# Patient Record
Sex: Female | Born: 1975
Health system: Southern US, Community
[De-identification: ages and names within clinical notes are randomized; demographics above are authoritative.]

## PROBLEM LIST (undated history)

## (undated) DIAGNOSIS — R000 Tachycardia, unspecified: Secondary | ICD-10-CM

## (undated) DIAGNOSIS — K829 Disease of gallbladder, unspecified: Secondary | ICD-10-CM

## (undated) DIAGNOSIS — C569 Malignant neoplasm of unspecified ovary: Secondary | ICD-10-CM

## (undated) DIAGNOSIS — C50919 Malignant neoplasm of unspecified site of unspecified female breast: Secondary | ICD-10-CM

## (undated) DIAGNOSIS — R9430 Abnormal result of cardiovascular function study, unspecified: Secondary | ICD-10-CM

## (undated) DIAGNOSIS — IMO0002 Reserved for concepts with insufficient information to code with codable children: Secondary | ICD-10-CM

## (undated) DIAGNOSIS — R55 Syncope and collapse: Secondary | ICD-10-CM

## (undated) DIAGNOSIS — R930 Abnormal findings on diagnostic imaging of skull and head, not elsewhere classified: Secondary | ICD-10-CM

## (undated) HISTORY — DX: Syncope and collapse: R55

## (undated) HISTORY — DX: Disease of gallbladder, unspecified: K82.9

## (undated) HISTORY — DX: Tachycardia, unspecified: R00.0

## (undated) HISTORY — PX: ABDOMINAL HYSTERECTOMY: SHX81

## (undated) HISTORY — PX: BREAST SURGERY: SHX581

## (undated) HISTORY — DX: Abnormal findings on diagnostic imaging of skull and head, not elsewhere classified: R93.0

## (undated) HISTORY — DX: Reserved for concepts with insufficient information to code with codable children: IMO0002

## (undated) HISTORY — DX: Abnormal result of cardiovascular function study, unspecified: R94.30

---

## 2000-01-13 ENCOUNTER — Other Ambulatory Visit: Admission: RE | Admit: 2000-01-13 | Discharge: 2000-01-13 | Payer: Self-pay | Admitting: Gynecology

## 2000-02-09 ENCOUNTER — Other Ambulatory Visit: Admission: RE | Admit: 2000-02-09 | Discharge: 2000-02-09 | Payer: Self-pay | Admitting: Gynecology

## 2000-10-19 ENCOUNTER — Other Ambulatory Visit: Admission: RE | Admit: 2000-10-19 | Discharge: 2000-10-19 | Payer: Self-pay | Admitting: Gynecology

## 2004-07-31 ENCOUNTER — Other Ambulatory Visit: Admission: RE | Admit: 2004-07-31 | Discharge: 2004-07-31 | Payer: Self-pay | Admitting: Obstetrics and Gynecology

## 2004-10-24 ENCOUNTER — Inpatient Hospital Stay (HOSPITAL_COMMUNITY): Admission: RE | Admit: 2004-10-24 | Discharge: 2004-10-25 | Payer: Self-pay | Admitting: Obstetrics and Gynecology

## 2011-04-06 ENCOUNTER — Emergency Department: Payer: Self-pay | Admitting: Emergency Medicine

## 2011-04-07 ENCOUNTER — Other Ambulatory Visit: Payer: Self-pay

## 2011-04-07 ENCOUNTER — Encounter: Payer: Self-pay | Admitting: Cardiology

## 2011-04-07 ENCOUNTER — Emergency Department (HOSPITAL_COMMUNITY): Payer: 59

## 2011-04-07 ENCOUNTER — Emergency Department (HOSPITAL_COMMUNITY)
Admission: EM | Admit: 2011-04-07 | Discharge: 2011-04-07 | Disposition: A | Discharge status: Home / Self Care | Payer: 59 | Attending: Emergency Medicine | Admitting: Emergency Medicine

## 2011-04-07 DIAGNOSIS — Z8543 Personal history of malignant neoplasm of ovary: Secondary | ICD-10-CM | POA: Insufficient documentation

## 2011-04-07 DIAGNOSIS — R112 Nausea with vomiting, unspecified: Secondary | ICD-10-CM | POA: Insufficient documentation

## 2011-04-07 DIAGNOSIS — R071 Chest pain on breathing: Secondary | ICD-10-CM | POA: Insufficient documentation

## 2011-04-07 DIAGNOSIS — R55 Syncope and collapse: Secondary | ICD-10-CM | POA: Insufficient documentation

## 2011-04-07 DIAGNOSIS — R51 Headache: Secondary | ICD-10-CM

## 2011-04-07 DIAGNOSIS — Z853 Personal history of malignant neoplasm of breast: Secondary | ICD-10-CM | POA: Insufficient documentation

## 2011-04-07 DIAGNOSIS — R0602 Shortness of breath: Secondary | ICD-10-CM | POA: Insufficient documentation

## 2011-04-07 DIAGNOSIS — M79609 Pain in unspecified limb: Secondary | ICD-10-CM | POA: Insufficient documentation

## 2011-04-07 HISTORY — DX: Malignant neoplasm of unspecified site of unspecified female breast: C50.919

## 2011-04-07 HISTORY — DX: Malignant neoplasm of unspecified ovary: C56.9

## 2011-04-07 LAB — CBC
HCT: 39.9 % (ref 36.0–46.0)
Hemoglobin: 13.4 g/dL (ref 12.0–15.0)
MCV: 83.5 fL (ref 78.0–100.0)
RBC: 4.78 MIL/uL (ref 3.87–5.11)
WBC: 9.8 10*3/uL (ref 4.0–10.5)

## 2011-04-07 LAB — COMPREHENSIVE METABOLIC PANEL
ALT: 26 U/L (ref 0–35)
CO2: 24 mEq/L (ref 19–32)
Calcium: 9.3 mg/dL (ref 8.4–10.5)
Creatinine, Ser: 0.65 mg/dL (ref 0.50–1.10)
GFR calc Af Amer: >90 mL/min (ref >90)
GFR calc non Af Amer: >90 mL/min (ref >90)
Glucose, Bld: 97 mg/dL (ref 70–99)
Total Bilirubin: 0.5 mg/dL (ref 0.3–1.2)

## 2011-04-07 LAB — DIFFERENTIAL
Eosinophils Relative: 0 % (ref 0–5)
Lymphocytes Relative: 23 % (ref 12–46)
Lymphs Abs: 2.3 10*3/uL (ref 0.7–4.0)
Monocytes Absolute: 0.7 10*3/uL (ref 0.1–1.0)

## 2011-04-07 LAB — PROTIME-INR: INR: 1.03 (ref 0.00–1.49)

## 2011-04-07 MED ORDER — POTASSIUM CHLORIDE CRYS ER 20 MEQ PO TBCR
40.0000 meq | EXTENDED_RELEASE_TABLET | Freq: Once | ORAL | Status: AC
  Administered 2011-04-07: 40 meq via ORAL
  Filled 2011-04-07: qty 2

## 2011-04-07 MED ORDER — IOHEXOL 350 MG/ML SOLN
100.0000 mL | Freq: Once | INTRAVENOUS | Status: AC | PRN
  Administered 2011-04-07: 100 mL via INTRAVENOUS

## 2011-04-07 MED ORDER — METOCLOPRAMIDE HCL 5 MG/ML IJ SOLN
10.0000 mg | Freq: Once | INTRAMUSCULAR | Status: AC
  Administered 2011-04-07: 10 mg via INTRAVENOUS
  Filled 2011-04-07: qty 2

## 2011-04-07 MED ORDER — MORPHINE SULFATE 4 MG/ML IJ SOLN
4.0000 mg | Freq: Once | INTRAMUSCULAR | Status: AC
  Administered 2011-04-07: 4 mg via INTRAVENOUS
  Filled 2011-04-07: qty 1

## 2011-04-07 MED ORDER — TRAMADOL HCL 50 MG PO TABS: 50.0000 mg | ORAL_TABLET | Freq: Four times a day (QID) | ORAL | Status: AC | PRN

## 2011-04-07 MED ORDER — DIPHENHYDRAMINE HCL 50 MG/ML IJ SOLN
25.0000 mg | Freq: Once | INTRAMUSCULAR | Status: AC
  Administered 2011-04-07: 25 mg via INTRAVENOUS
  Filled 2011-04-07: qty 1

## 2011-04-07 MED ORDER — ONDANSETRON HCL 4 MG/2ML IJ SOLN
4.0000 mg | Freq: Once | INTRAMUSCULAR | Status: AC
Start: 2011-04-07 — End: 2011-04-07
  Administered 2011-04-07: 4 mg via INTRAVENOUS
  Filled 2011-04-07: qty 2

## 2011-04-07 NOTE — ED Provider Notes (Signed)
  Physical Exam  BP 131/88  Pulse 94  Temp(Src) 98.1 F (36.7 C) (Oral)  Resp 20  SpO2 97%  Physical Exam  ED Course  Procedures  MDM 7:37 PM  Spoke with Patient and family about labs and imaging. Patient states she will f/u with her PCP for further evaluation. Advised immediate return to emergency room for worsening symptoms. Patient and family agree that ready for discharge. Given patient's age groups     Deanna Taylor, [place] 04/07/11 1943      Deanna Taylor, [place] 04/07/11 2024

## 2011-04-07 NOTE — ED Provider Notes (Deleted)
History     CSN: [id number] Arrival date & time: 04/07/2011  8:43 AM   First MD Initiated Contact with Patient 04/07/11 [phone]      Chief Complaint  Patient presents with  . Chest Pain    (Consider location/radiation/quality/duration/timing/severity/associated sxs/prior treatment) HPI  Past Medical History  Diagnosis Date  . Breast cancer   . Ovarian cancer     History reviewed. No pertinent past surgical history.  History reviewed. No pertinent family history.  History  Substance Use Topics  . Smoking status: Not on file  . Smokeless tobacco: Not on file  . Alcohol Use:     OB History    Grav Para Term Preterm Abortions TAB SAB Ect Mult Living                  Review of Systems  Allergies  Review of patient's allergies indicates no known allergies.  Home Medications   Current Outpatient Rx  Name Route Sig Dispense Refill  . ALBUTEROL SULFATE HFA 108 (90 BASE) MCG/ACT IN AERS Inhalation Inhale 2 puffs into the lungs 2 (two) times daily as needed. For wheezing     . TAMOXIFEN CITRATE 10 MG PO TABS Oral Take 10 mg by mouth daily.        BP 131/88  Pulse 94  Temp(Src) 98.1 F (36.7 C) (Oral)  Resp 20  SpO2 97%  Physical Exam  ED Course  Procedures  Results for orders placed during the hospital encounter of 04/07/11  CBC      Component Value Range   WBC 9.8  4.0 - 10.5 (K/uL)   RBC 4.78  3.87 - 5.11 (MIL/uL)   Hemoglobin 13.4  12.0 - 15.0 (g/dL)   HCT 11.9  14.7 - 82.9 (%)   MCV 83.5  78.0 - 100.0 (fL)   MCH 28.0  26.0 - 34.0 (pg)   MCHC 33.6  30.0 - 36.0 (g/dL)   RDW 56.2  13.0 - 86.5 (%)   Platelets 381  150 - 400 (K/uL)  DIFFERENTIAL      Component Value Range   Neutrophils Relative 69  43 - 77 (%)   Neutro Abs 6.8  1.7 - 7.7 (K/uL)   Lymphocytes Relative 23  12 - 46 (%)   Lymphs Abs 2.3  0.7 - 4.0 (K/uL)   Monocytes Relative 7  3 - 12 (%)   Monocytes Absolute 0.7  0.1 - 1.0 (K/uL)   Eosinophils Relative 0  0 - 5 (%)   Eosinophils  Absolute 0.0  0.0 - 0.7 (K/uL)   Basophils Relative 0  0 - 1 (%)   Basophils Absolute 0.0  0.0 - 0.1 (K/uL)  COMPREHENSIVE METABOLIC PANEL      Component Value Range   Sodium 139  135 - 145 (mEq/L)   Potassium 3.3 (*) 3.5 - 5.1 (mEq/L)   Chloride 104  96 - 112 (mEq/L)   CO2 24  19 - 32 (mEq/L)   Glucose, Bld 97  70 - 99 (mg/dL)   BUN 7  6 - 23 (mg/dL)   Creatinine, Ser 7.84  0.50 - 1.10 (mg/dL)   Calcium 9.3  8.4 - 69.6 (mg/dL)   Total Protein 7.8  6.0 - 8.3 (g/dL)   Albumin 3.3 (*) 3.5 - 5.2 (g/dL)   AST 18  0 - 37 (U/L)   ALT 26  0 - 35 (U/L)   Alkaline Phosphatase 63  39 - 117 (U/L)   Total Bilirubin 0.5  0.3 -  1.2 (mg/dL)   GFR calc non Af Amer >90  >90 (mL/min)   GFR calc Af Amer >90  >90 (mL/min)  APTT      Component Value Range   aPTT 27  24 - 37 (seconds)  PROTIME-INR      Component Value Range   Prothrombin Time 13.7  11.6 - 15.2 (seconds)   INR 1.03  0.00 - 1.49   POCT I-STAT TROPONIN I      Component Value Range   Troponin i, poc 0.00  0.00 - 0.08 (ng/mL)   Comment 3                MDM  7:37 PM  Spoke with Patient and family about labs and imaging. Patient states she will f/u with her PCP for further evaluation. Advised immediate return to emergency room for worsening symptoms. Patient and family agree that ready for discharge. Given patient's age groups     Thomasene Lot, [place] 04/07/11 1943

## 2011-04-07 NOTE — ED Provider Notes (Signed)
11:48 AM  Patient in CDU holding for MR head and CT angio chest.  Pt reports pain in chest has improved, though she still has discomfort with taking a deep breath.  Reports headache behind her eyes.  On exam, pt is A&Ox4, NAD, RRR, CTAB, left chest TTP, extremities without edema, distal pulses intact.  Will continue to follow.    2:32 PM Patient with continued headache.  Has requested migraine medications.    Discussed MR head with Dr Lynelle Doctor.  Patient to follow up with PCP regarding findings.    3:22 PM Patient signed out to Thomasene Lot, PA-C, at change of shift pending CT angio chest.     Rise Patience, PA 04/07/11 1522

## 2011-04-07 NOTE — ED Notes (Signed)
I applied a restriction band to pt's left arm, pt has had a lumpectomy. AM JG.

## 2011-04-07 NOTE — ED Notes (Signed)
Pt reports that she had a sudden onset of chest pain this morning. Reports that she was seen at [facility] for the same yesterday. Reports that she was told to follow-up with cardiology. Bp- 105/78 Hr-110 20g in the left hand

## 2011-04-07 NOTE — ED Provider Notes (Signed)
See CDU notes  Ward Givens, MD 04/07/11 [phone]

## 2011-04-07 NOTE — ED Provider Notes (Cosign Needed)
History     CSN: [id number] Arrival date & time: 04/07/2011  8:43 AM   First MD Initiated Contact with Patient 04/07/11 [phone]      Chief Complaint  Patient presents with  . Chest Pain    (Consider location/radiation/quality/duration/timing/severity/associated sxs/prior treatment) HPI  She relates yesterday she was at work making a routine phone call and had abrupt onset of sharp chest pain that became tight and she indicates the left side of her chest. She states and then developed to a pulling sensation like someone sitting on her chest. She states that lasted about 5 minutes. She also developed a sharp headache behind her right eye that lasted for hours. She relates she must have passed out because her coworkers called EMS and told her she had passed out. She does not remember feeling presyncopal. She relates afterwards she felt warm for a few minutes with nausea and vomiting but no diarrhea or abdominal pain. She states she was trembling afterwards and she was taken to [facility] DD by EMS. There she was given Dilaudid and Ativan and was diagnosed with panic attack and hypertension. She relates today she was sleeping at about 6:30 AM was awakened again with chest pain. She states nothing makes it feel worse nothing makes it feel better. It had it does have a mild pleuritic component to it she denies shortness of breath and does have some achiness in her calves. She thinks she may have had a fever yesterday when she was in the ER at 101. She denies cough and states her headache is back behind her right thigh. She was given nitroglycerin x2 today and get 4 baby aspirin by EMS which improved her chest pain. Denies any trauma or change in activity.  He should has a history of breast cancer about 4 years ago. She is on tamoxifen.  Primary care physician Dr. Sudie Bailey Oncologist Dr.Khan  Past Medical History  Diagnosis Date  . Breast cancer   . Ovarian cancer     History reviewed. No pertinent  past surgical history.  Left breast lumpectomy  History reviewed. No pertinent family history.  A maternal and had DVT, PE  Another maternal aunt had DVT and a cranial aneurysm  paternal grandmother had congestive heart failure Maternal grandmother had bypass surgery   History  Substance Use Topics  . Smoking status: no  . Smokeless tobacco: Not on file  . Alcohol Use: no   employed  OB History    Grav Para Term Preterm Abortions TAB SAB Ect Mult Living                  Review of Systems  All other systems reviewed and are negative.    Allergies  Review of patient's allergies indicates no known allergies.  Home Medications   Current Outpatient Rx  Name Route Sig Dispense Refill  . ALBUTEROL SULFATE HFA 108 (90 BASE) MCG/ACT IN AERS Inhalation Inhale 2 puffs into the lungs 2 (two) times daily as needed. For wheezing     . TAMOXIFEN CITRATE 10 MG PO TABS Oral Take 10 mg by mouth daily.        BP 123/85  Pulse 105  Temp(Src) 98.3 F (36.8 C) (Oral)  Resp 27  SpO2 100%  Vital signs tachycardia otherwise normal  Physical Exam  Nursing note and vitals reviewed. Constitutional: She is oriented to person, place, and time. She appears well-developed and well-nourished.  Non-toxic appearance. She does not appear ill. No distress.  HENT:  Head: Normocephalic and atraumatic.  Right Ear: External ear normal.  Left Ear: External ear normal.  Nose: Nose normal. No mucosal edema or rhinorrhea.  Mouth/Throat: Oropharynx is clear and moist and mucous membranes are normal. No dental abscesses or uvula swelling.  Eyes: Conjunctivae and EOM are normal. Pupils are equal, round, and reactive to light.  Neck: Normal range of motion and full passive range of motion without pain. Neck supple.  Cardiovascular: Normal rate, regular rhythm and normal heart sounds.  Exam reveals no gallop and no friction rub.   No murmur heard. Pulmonary/Chest: Effort normal and breath sounds normal.  No respiratory distress. She has no wheezes. She has no rhonchi. She has no rales. She exhibits tenderness. She exhibits no crepitus.       Mild chest tenderness around the sternum  Abdominal: Soft. Normal appearance and bowel sounds are normal. She exhibits no distension. There is no tenderness. There is no rebound and no guarding.  Musculoskeletal: Normal range of motion. She exhibits no edema and no tenderness.       Moves all extremities well.   Neurological: She is alert and oriented to person, place, and time. She has normal strength. No cranial nerve deficit.  Skin: Skin is warm, dry and intact. No rash noted. No erythema. No pallor.  Psychiatric: She has a normal mood and affect. Her speech is normal and behavior is normal. Her mood appears not anxious.    ED Course  Procedures (including critical care time)  Results for orders placed during the hospital encounter of 04/07/11  CBC      Component Value Range   WBC 9.8  4.0 - 10.5 (K/uL)   RBC 4.78  3.87 - 5.11 (MIL/uL)   Hemoglobin 13.4  12.0 - 15.0 (g/dL)   HCT 52.8  41.3 - 24.4 (%)   MCV 83.5  78.0 - 100.0 (fL)   MCH 28.0  26.0 - 34.0 (pg)   MCHC 33.6  30.0 - 36.0 (g/dL)   RDW 01.0  27.2 - 53.6 (%)   Platelets 381  150 - 400 (K/uL)  DIFFERENTIAL      Component Value Range   Neutrophils Relative 69  43 - 77 (%)   Neutro Abs 6.8  1.7 - 7.7 (K/uL)   Lymphocytes Relative 23  12 - 46 (%)   Lymphs Abs 2.3  0.7 - 4.0 (K/uL)   Monocytes Relative 7  3 - 12 (%)   Monocytes Absolute 0.7  0.1 - 1.0 (K/uL)   Eosinophils Relative 0  0 - 5 (%)   Eosinophils Absolute 0.0  0.0 - 0.7 (K/uL)   Basophils Relative 0  0 - 1 (%)   Basophils Absolute 0.0  0.0 - 0.1 (K/uL)  COMPREHENSIVE METABOLIC PANEL      Component Value Range   Sodium 139  135 - 145 (mEq/L)   Potassium 3.3 (*) 3.5 - 5.1 (mEq/L)   Chloride 104  96 - 112 (mEq/L)   CO2 24  19 - 32 (mEq/L)   Glucose, Bld 97  70 - 99 (mg/dL)   BUN 7  6 - 23 (mg/dL)   Creatinine, Ser 6.44   0.50 - 1.10 (mg/dL)   Calcium 9.3  8.4 - 03.4 (mg/dL)   Total Protein 7.8  6.0 - 8.3 (g/dL)   Albumin 3.3 (*) 3.5 - 5.2 (g/dL)   AST 18  0 - 37 (U/L)   ALT 26  0 - 35 (U/L)   Alkaline Phosphatase 63  39 -  117 (U/L)   Total Bilirubin 0.5  0.3 - 1.2 (mg/dL)   GFR calc non Af Amer >90  >90 (mL/min)   GFR calc Af Amer >90  >90 (mL/min)  APTT      Component Value Range   aPTT 27  24 - 37 (seconds)  PROTIME-INR      Component Value Range   Prothrombin Time 13.7  11.6 - 15.2 (seconds)   INR 1.03  0.00 - 1.49   POCT I-STAT TROPONIN I      Component Value Range   Troponin i, poc 0.00  0.00 - 0.08 (ng/mL)   Comment 3            Laboratory Interpretation hypokalemia otherwise normal   Ct Angio Chest W/cm &/or Wo Cm  04/07/2011  *RADIOLOGY REPORT*  Clinical Data:  Chest pain.  Shortness of breath.  Syncope.  CT ANGIOGRAPHY CHEST WITH CONTRAST 04/07/2011:  Technique:  Multidetector CT imaging of the chest was performed using the standard protocol during bolus administration of intravenous contrast.  Multiplanar CT image reconstructions including MIPs were obtained to evaluate the vascular anatomy.  Contrast: 100 ml Omnipaque 350 IV.  Comparison:  No prior CT.  Portable chest x-ray earlier same date.  Findings:  Contrast opacification of pulmonary arteries is good. No filling defects within either main pulmonary artery or their branches in either lung to suggest pulmonary embolism.  Heart size upper normal.  No pericardial effusion.  No visible coronary artery calcification.  No visible atherosclerosis involving the thoracic or upper abdominal aorta or their visualized branches.  Low lung volumes account for atelectasis in the lower lobes.  Lungs otherwise clear without localized airspace consolidation, interstitial disease, or parenchymal nodules or masses.  No pleural effusions.  Central airways patent.  No significant mediastinal, hilar, or axillary lymphadenopathy. Visualized thyroid gland  unremarkable.  High attenuation material within the gallbladder.  Visualized upper abdomen otherwise unremarkable.  Bone window images unremarkable.  Review of the MIP images confirms the above findings.  IMPRESSION:  1.  No evidence of pulmonary embolism. 2.  Low lung volumes account for mild atelectasis in the lower lobes.  No acute cardiopulmonary disease otherwise. 3.  Gallbladder sludge and/or numerous tiny gallstones.  Original Report Authenticated By: Arnell Sieving, M.D.   Mr Angiogram Head Wo Contrast  04/07/2011  *RADIOLOGY REPORT*  Clinical Data:   Family history of aneurysms.  Headaches.  Chest pain.  MRA HEAD WITHOUT CONTRAST  Technique: Angiographic images of the Circle of Willis were obtained using MRA technique without intravenous contrast. Source images were also evaluated  Comparison: None.  Findings:  The right internal carotid artery in its petrous segment is normal.  There is a mild focal prominence at the petrous cavernous junction. Distal to this vessel is of normal caliber in the cavernous and supraclinoid segments.  The right middle and the right anterior cerebral arteries demonstrate adequate caliber and flow signal.  The anterior communicating artery is patent  with flow signal the left anterior cerebral artery into the A3 regions the right internal carotid artery.  A right posterior communicating artery is also seen.  The left internal carotid artery in its petrous and its proximal cavernous segments appears widely patent.  There is a focal tapered significant reduced caliber involving the distal cavernous/supraclinoid junctions.  Distal to this the supraclinoid segment is normal.  The left middle cerebral artery is seen to be of adequate caliber and flow signal.  No appreciable flow signal is  seen the left anterior cerebral artery A1 segment.  Vertebrobasilar junctions are patent with flow signal  seen in both posterior-inferior cerebellar arteries.  The basilar artery, the  posterior cerebral arteries, the superior cerebellar arteries and the anterior-inferior cerebellar arteries demonstrate adequate caliber and flow signal.  IMPRESSION: 1.Mild focal prominence of the right internal carotid artery at the petrous cavernous junction, which may represent an eccentric atherosclerotic plaque.  2. Significantly decreased caliber of the left internal carotid artery at the cavernous supraclinoid junction.  This may represent a potentially significant atherosclerotic related stenosis. 3. 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  Original Report Authenticated By: Oneal Grout, M.D.   Dg Chest Portable 1 View  04/07/2011  *RADIOLOGY REPORT*  Clinical Data: Chest pain.  Syncopal episode at work yesterday.  PORTABLE CHEST - 1 VIEW  Comparison: None.  Findings: The heart size is normal.  The lung volumes are low.  No focal airspace disease is evident.  The visualized soft tissues and bony thorax are unremarkable.  IMPRESSION:  1.  No acute cardiopulmonary disease. 2.  Low lung volumes.  Original Report Authenticated By: Jamesetta Orleans. MATTERN, M.D.     Date: 04/07/2011  Rate: 104  Rhythm: sinus tachycardia  QRS Axis: normal  Intervals: normal  ST/T Wave abnormalities: nonspecific T wave changes  Conduction Disutrbances:none  Narrative Interpretation:   Old EKG Reviewed: none available  Pt placed in CDU to get her CT angio chest to r/o PE and her MR brain to r/o  Aneurysm   Diagnosis Headache Chest pain syncope  Devoria Albe, MD, Armando Gang    MDM          Ward Givens, MD 04/07/11 [phone]

## 2011-04-07 NOTE — ED Notes (Signed)
IV infusing,  Questionable if infiltrated, called IV team to re-evaluate site, st's they are busy and ask if someone else could look at it.  Mandy, RN assessed IV and thinks it is OK.  CT called and made aware of new site, st's they will

## 2011-04-07 NOTE — ED Provider Notes (Signed)
Evaluation and management procedures were performed by the PA/NP under my supervision/collaboration.   Shary Lamos, MD 04/07/11 2346 

## 2011-04-07 NOTE — ED Notes (Signed)
Pt brought back from CT due to IV ? Infiltration.  CT pulled IV.  Will attempt another IV access.

## 2011-04-16 ENCOUNTER — Encounter: Payer: 59 | Admitting: Cardiology

## 2011-04-29 ENCOUNTER — Encounter: Payer: Self-pay | Admitting: Cardiology

## 2011-04-29 DIAGNOSIS — R079 Chest pain, unspecified: Secondary | ICD-10-CM | POA: Insufficient documentation

## 2011-04-30 ENCOUNTER — Ambulatory Visit (INDEPENDENT_AMBULATORY_CARE_PROVIDER_SITE_OTHER): Payer: 59 | Admitting: Cardiology

## 2011-04-30 ENCOUNTER — Encounter: Payer: Self-pay | Admitting: Cardiology

## 2011-04-30 DIAGNOSIS — R55 Syncope and collapse: Secondary | ICD-10-CM

## 2011-04-30 DIAGNOSIS — R079 Chest pain, unspecified: Secondary | ICD-10-CM

## 2011-04-30 DIAGNOSIS — R930 Abnormal findings on diagnostic imaging of skull and head, not elsewhere classified: Secondary | ICD-10-CM

## 2011-04-30 DIAGNOSIS — R000 Tachycardia, unspecified: Secondary | ICD-10-CM

## 2011-04-30 DIAGNOSIS — K829 Disease of gallbladder, unspecified: Secondary | ICD-10-CM

## 2011-04-30 DIAGNOSIS — R072 Precordial pain: Secondary | ICD-10-CM

## 2011-04-30 NOTE — Assessment & Plan Note (Signed)
There is an incidental finding of gallbladder sludge and or numerous tiny stones by chest CT. It does not appear that she's having significant symptoms from this at this time. I feel that this is not the basis of her chest discomfort currently.

## 2011-04-30 NOTE — Assessment & Plan Note (Signed)
The description of the MRA report is in the history of present illness of this note. The study was done April 07, 2011 at [facility]. It was read by Dr.Deveshwar. Dr. Corliss Skains has a great expertise in the evaluation of the vascular anatomy of the head and neck. I will refer the patient to see Dr. Corliss Skains in consultation at [facility].

## 2011-04-30 NOTE — Progress Notes (Signed)
HPI  The patient is being seen in consultation for the evaluation of chest pain and syncope. She is a very pleasant, reliable, 35 year old female. There is no known documented cardiovascular disease. On April 06, 2011 the patient felt poorly at work with some chest discomfort and nausea. She had an episode of syncope. She was evaluated at the [facility] emergency room. These records are not available to me. The patient was then at home and resting during the evening. When she got up on the morning of April 07, 2011, she then again had some recurrent chest discomfort and some nausea. She had a second syncopal episode. This was witnessed by her mother. There was no definite seizure activity. She was transported to [facility]. I have reviewed the records from that emergency room visit extensively. There was no evidence of cardiac injury. She had a CT scan of the chest revealing no pulmonary or malaise. There was gallbladder sludge and/or numerous tiny gallstones.  The patient also had some headaches. There was a family history of aneurysms. Therefore MRA of the head was done and read by Dr.Deveshwar. The impression included   1. Mild focal prominence of the right internal carotid artery at the [facility] cavernous junction, which may represent an eccentric atherosclerotic plaque.    2. Significantly decreased caliber of the left internal carotid artery at the cavernous supraclinoid junction. This may represent a potentially significant atherosclerotic related stenosis.   3. 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  The patient was stable and was allowed to go home from the emergency room. Since that time she has had some continuous chest discomfort. There is definite pain to palpation. Her primary physician felt that she probably had costochondritis. She has taken some ibuprofen that has helped. There is no exertional component to this discomfort.  She says it may bother her more at nighttime in the lying position.  No Known Allergies  Current Outpatient Prescriptions  Medication Sig Dispense Refill  . albuterol (PROVENTIL HFA;VENTOLIN HFA) 108 (90 BASE) MCG/ACT inhaler Inhale 2 puffs into the lungs 2 (two) times daily as needed. For wheezing       . tamoxifen (NOLVADEX) 10 MG tablet Take 10 mg by mouth daily.          History   Social History  . Marital Status: Married    Spouse Name: N/A    Number of Children: N/A  . Years of Education: N/A   Occupational History  . Not on file.   Social History Main Topics  . Smoking status: Never Smoker   . Smokeless tobacco: Never Used  . Alcohol Use: Not on file  . Drug Use: Not on file  . Sexually Active: Not on file   Other Topics Concern  . Not on file   Social History Narrative  . No narrative on file    No family history on file.  Past Medical History  Diagnosis Date  . Breast cancer   . Ovarian cancer   . Chest pain     Emergency room April 07, 2011  . Syncope     December, 2012  . Abnormal MRA, head     December, 2012    No past surgical history on file.  ROS  Patient denies fever, chills, headache, sweats, rash, change in vision, change in hearing, cough, nausea vomiting, urinary symptoms. All other systems are reviewed and are negative.   PHYSICAL EXAM Patient is  stable. She is overweight. She is oriented to person time and place. Affect is normal. Head is atraumatic. There is no jugulovenous distention. There are no carotid bruits. Lungs are clear. Respiratory effort is nonlabored. Cardiac exam reveals an S1 and S2. There no clicks or significant murmurs. The abdomen is soft. There is no peripheral edema. There no musculoskeletal deformities. There are no skin rashes.  Filed Vitals:   04/30/11 0849  BP: 127/88  Pulse: 99  Height: 5\' 1"  (1.549 m)  Weight: 199 lb (90.266 kg)  SpO2: 100%    EKG EKG is done today and reviewed by me. There is  normal sinus rhythm. The QRS is normal. There is no diagnostic abnormality.  ASSESSMENT & PLAN

## 2011-04-30 NOTE — Assessment & Plan Note (Addendum)
Patient has borderline sinus tachycardia. I do not see a TSH result from the labs at [facility]. This may have been drawn through Dr. Sherril Croon' office recently. We will check for this. TSH result is obtained. The value is 3.1, normal

## 2011-04-30 NOTE — Patient Instructions (Signed)
   Echo  21 day Cardionet monitor  If the results of your test are normal or stable, you will receive a letter.  If they are abnormal, the nurse will contact you by phone. Your physician wants you to follow up in:  1 month.  See above for appointment.

## 2011-04-30 NOTE — Assessment & Plan Note (Signed)
At this point there is no definite proof of coronary artery disease. Two-dimensional echo will be done to assess her LV function. I have considered an exercise test. I will decide about this in the future. She does have musculoskeletal chest pain. This is the most likely etiology of her pain at this time.

## 2011-04-30 NOTE — Assessment & Plan Note (Signed)
The patient's syncope is concerning. She will have a two-dimensional echo. Both episodes occurred around the time that she felt her chest discomfort. It is possible that this could be a vagal reaction. She has not had syncope in the past. I am arranging for an event recorder to fully assess her rhythm over several weeks.

## 2011-05-04 ENCOUNTER — Other Ambulatory Visit: Payer: Self-pay | Admitting: Interventional Radiology

## 2011-05-04 ENCOUNTER — Other Ambulatory Visit (HOSPITAL_COMMUNITY): Payer: Self-pay | Admitting: Interventional Radiology

## 2011-05-04 DIAGNOSIS — R930 Abnormal findings on diagnostic imaging of skull and head, not elsewhere classified: Secondary | ICD-10-CM

## 2011-05-04 DIAGNOSIS — I771 Stricture of artery: Secondary | ICD-10-CM

## 2011-05-07 ENCOUNTER — Other Ambulatory Visit (INDEPENDENT_AMBULATORY_CARE_PROVIDER_SITE_OTHER): Payer: 59 | Admitting: *Deleted

## 2011-05-07 DIAGNOSIS — R55 Syncope and collapse: Secondary | ICD-10-CM

## 2011-05-07 DIAGNOSIS — R079 Chest pain, unspecified: Secondary | ICD-10-CM

## 2011-05-07 DIAGNOSIS — R072 Precordial pain: Secondary | ICD-10-CM

## 2011-05-08 ENCOUNTER — Ambulatory Visit (HOSPITAL_COMMUNITY)
Admission: RE | Admit: 2011-05-08 | Discharge: 2011-05-08 | Disposition: A | Discharge status: Home / Self Care | Payer: 59 | Source: Ambulatory Visit | Attending: Interventional Radiology | Admitting: Interventional Radiology

## 2011-05-08 ENCOUNTER — Other Ambulatory Visit (HOSPITAL_COMMUNITY): Payer: Self-pay | Admitting: Interventional Radiology

## 2011-05-08 DIAGNOSIS — I771 Stricture of artery: Secondary | ICD-10-CM

## 2011-05-08 DIAGNOSIS — I701 Atherosclerosis of renal artery: Secondary | ICD-10-CM

## 2011-05-11 ENCOUNTER — Other Ambulatory Visit: Payer: Self-pay | Admitting: Radiology

## 2011-05-11 ENCOUNTER — Encounter (HOSPITAL_COMMUNITY): Payer: Self-pay | Admitting: Pharmacy Technician

## 2011-05-12 ENCOUNTER — Other Ambulatory Visit (HOSPITAL_COMMUNITY): Payer: Self-pay | Admitting: Interventional Radiology

## 2011-05-12 ENCOUNTER — Ambulatory Visit (HOSPITAL_COMMUNITY)
Admission: RE | Admit: 2011-05-12 | Discharge: 2011-05-12 | Disposition: A | Discharge status: Home / Self Care | Payer: 59 | Source: Ambulatory Visit | Attending: Interventional Radiology | Admitting: Interventional Radiology

## 2011-05-12 DIAGNOSIS — I701 Atherosclerosis of renal artery: Secondary | ICD-10-CM

## 2011-05-12 DIAGNOSIS — Z853 Personal history of malignant neoplasm of breast: Secondary | ICD-10-CM | POA: Insufficient documentation

## 2011-05-12 DIAGNOSIS — R9409 Abnormal results of other function studies of central nervous system: Secondary | ICD-10-CM | POA: Insufficient documentation

## 2011-05-12 DIAGNOSIS — Z8543 Personal history of malignant neoplasm of ovary: Secondary | ICD-10-CM | POA: Insufficient documentation

## 2011-05-12 DIAGNOSIS — R51 Headache: Secondary | ICD-10-CM | POA: Insufficient documentation

## 2011-05-12 LAB — CBC
Hemoglobin: 12.2 g/dL (ref 12.0–15.0)
MCH: 27.7 pg (ref 26.0–34.0)
MCHC: 33.1 g/dL (ref 30.0–36.0)
Platelets: 366 10*3/uL (ref 150–400)
RDW: 14 % (ref 11.5–15.5)

## 2011-05-12 LAB — BASIC METABOLIC PANEL
BUN: 19 mg/dL (ref 6–23)
Calcium: 9.3 mg/dL (ref 8.4–10.5)
Creatinine, Ser: 0.67 mg/dL (ref 0.50–1.10)
GFR calc Af Amer: >90 mL/min (ref >90)
GFR calc non Af Amer: >90 mL/min (ref >90)
Potassium: 4 mEq/L (ref 3.5–5.1)

## 2011-05-12 LAB — PROTIME-INR
INR: 0.91 (ref 0.00–1.49)
Prothrombin Time: 12.5 seconds (ref 11.6–15.2)

## 2011-05-12 LAB — DIFFERENTIAL
Basophils Absolute: 0 10*3/uL (ref 0.0–0.1)
Basophils Relative: 0 % (ref 0–1)
Eosinophils Absolute: 0 10*3/uL (ref 0.0–0.7)
Neutro Abs: 7 10*3/uL (ref 1.7–7.7)
Neutrophils Relative %: 68 % (ref 43–77)

## 2011-05-12 MED ORDER — IOHEXOL 300 MG/ML  SOLN
150.0000 mL | Freq: Once | INTRAMUSCULAR | Status: AC | PRN
  Administered 2011-05-12: 84 mL via INTRA_ARTERIAL

## 2011-05-12 MED ORDER — MIDAZOLAM HCL 2 MG/2ML IJ SOLN
INTRAMUSCULAR | Status: AC
  Filled 2011-05-12: qty 6

## 2011-05-12 MED ORDER — FENTANYL CITRATE 0.05 MG/ML IJ SOLN
INTRAMUSCULAR | Status: AC
  Filled 2011-05-12: qty 4

## 2011-05-12 MED ORDER — FENTANYL CITRATE 0.05 MG/ML IJ SOLN
INTRAMUSCULAR | Status: AC | PRN
  Administered 2011-05-12 (×3): 25 ug via INTRAVENOUS

## 2011-05-12 MED ORDER — SODIUM CHLORIDE 0.9 % IV SOLN
INTRAVENOUS | Status: DC
  Administered 2011-05-12: 75 mL/h via INTRAVENOUS
  Administered 2011-05-12: 07:00:00 via INTRAVENOUS

## 2011-05-12 MED ORDER — MIDAZOLAM HCL 5 MG/5ML IJ SOLN
INTRAMUSCULAR | Status: AC | PRN
  Administered 2011-05-12 (×2): 1 mg via INTRAVENOUS

## 2011-05-12 MED ORDER — HEPARIN SOD (PORK) LOCK FLUSH 100 UNIT/ML IV SOLN
500.0000 [IU] | Freq: Once | INTRAVENOUS | Status: AC
  Administered 2011-05-12: 500 [IU] via INTRAVENOUS

## 2011-05-12 MED ORDER — ONDANSETRON HCL 4 MG/2ML IJ SOLN
INTRAMUSCULAR | Status: AC
  Administered 2011-05-12: 4 mg via INTRAVENOUS
  Filled 2011-05-12: qty 2

## 2011-05-12 MED ORDER — SODIUM CHLORIDE 0.9 % IV SOLN: INTRAVENOUS | Status: AC

## 2011-05-12 NOTE — Progress Notes (Signed)
After pt walked in the hall she felt dizzy, rechecked vs and had her sit up a few minutes to see if she felt better.  Will continue to monitor

## 2011-05-12 NOTE — H&P (Signed)
Deanna Taylor is an 36 y.o. female.   Chief Complaint: headaches, possible left carotid artery stenosis HPI: Patient with history of headaches, family history of cerebral aneurysms and recent MRA head revealing possible left internal carotid artery stenosis presents today for elective diagnostic cerebral arteriogram for further evaluation.  Past Medical History  Diagnosis Date  . Breast cancer   . Ovarian cancer   . Chest pain     Emergency room April 07, 2011  . Syncope     December, 2012  . Abnormal MRA, head     December, 2012  . Gallbladder disease     CT chest December, 2012, gallbladder sludge and/or numerous tiny gallstones  . Tachycardia     Borderline sinus tachycardia December, 2012    PSH: hysterectomy, left breast lumpectomy with LN dissection Social History:  reports that she has never smoked. She has never used smokeless tobacco. Her alcohol and drug histories not on file. Family History: positive for cerebral aneurysms, HTN, DM,CAD Allergies: No Known Allergies  Medications Prior to Admission  Medication Sig Dispense Refill  . albuterol (PROVENTIL HFA;VENTOLIN HFA) 108 (90 BASE) MCG/ACT inhaler Inhale 2 puffs into the lungs 2 (two) times daily as needed. For wheezing       . tamoxifen (NOLVADEX) 10 MG tablet Take 10 mg by mouth daily.         Medications Prior to Admission  Medication Dose Route Frequency Provider Last Rate Last Dose  . 0.9 %  sodium chloride infusion   Intravenous Continuous Robet Leu, PA 20 mL/hr at 05/12/11 1610      Results for orders placed during the hospital encounter of 05/12/11 (from the past 48 hour(s))  APTT     Status: Normal   Collection Time   05/12/11  7:05 AM      Component Value Range Comment   aPTT 27  24 - 37 (seconds)   BASIC METABOLIC PANEL     Status: Normal   Collection Time   05/12/11  7:05 AM      Component Value Range Comment   Sodium 140  135 - 145 (mEq/L)    Potassium 4.0  3.5 - 5.1 (mEq/L)    Chloride 105   96 - 112 (mEq/L)    CO2 25  19 - 32 (mEq/L)    Glucose, Bld 97  70 - 99 (mg/dL)    BUN 19  6 - 23 (mg/dL)    Creatinine, Ser 9.60  0.50 - 1.10 (mg/dL)    Calcium 9.3  8.4 - 10.5 (mg/dL)    GFR calc non Af Amer >90  >90 (mL/min)    GFR calc Af Amer >90  >90 (mL/min)   PROTIME-INR     Status: Normal   Collection Time   05/12/11  7:05 AM      Component Value Range Comment   Prothrombin Time 12.5  11.6 - 15.2 (seconds)    INR 0.91  0.00 - 1.49     No results found.  Review of Systems  Constitutional: Negative for fever and chills.  Eyes: Negative for blurred vision and double vision.  Respiratory: Negative for cough and shortness of breath.   Cardiovascular: Positive for chest pain.       Episodes of syncope in past  ? Vasovagal  Neg cardiac w/u  Gastrointestinal: Negative for nausea, vomiting and abdominal pain.  Neurological: Positive for headaches. Negative for dizziness, tingling, tremors, sensory change, speech change, focal weakness and seizures.  Endo/Heme/Allergies: Does  not bruise/bleed easily.    Blood pressure 117/81, pulse 94, temperature 98.1 F (36.7 C), temperature source Oral, resp. rate 18, height 5\' 1"  (1.549 m), weight 187 lb (84.823 kg), SpO2 100.00%. Physical Exam  Constitutional: She is oriented to person, place, and time. She appears well-developed and well-nourished.  Cardiovascular: Normal rate and regular rhythm.   Respiratory: Effort normal and breath sounds normal.  GI: Soft. Bowel sounds are normal.  Musculoskeletal: Normal range of motion. She exhibits no edema.  Neurological: She is alert and oriented to person, place, and time. No cranial nerve deficit. Coordination normal.  Psychiatric: She has a normal mood and affect.     Assessment/Plan: Patient with history of headaches and recent MRA head revealing possible left internal carotid artery stenosis; plan is for diagnostic cerebral arteriogram. Jarret Torre,D KEVIN 05/12/2011, 8:10 AM

## 2011-05-12 NOTE — Procedures (Signed)
S/P 4 vessel cerebral arteriogram.  Rt CFA approach. Preliminary findings. 1. 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 No acute complications.

## 2011-05-12 NOTE — Progress Notes (Signed)
Pt stated she felt better now, no dizziness.  Ambulated pt in hallway and pt stated she had no dizziness. Reviewed pt d/c instructions wit her an her family who verbalized understanding.  Pt d/c home via wheelchair with family.

## 2011-05-13 ENCOUNTER — Telehealth: Payer: Self-pay | Admitting: *Deleted

## 2011-05-13 NOTE — Telephone Encounter (Signed)
Pt notified and verbalized understanding.

## 2011-05-13 NOTE — Telephone Encounter (Signed)
Pt left message on voicemail asking for return call.   Pt states she has not received monitor ordered in December by Dr. Myrtis Ser. Monitor order was placed on 12/28. Per [employer], event monitor's are on back order. She will change order to STAT and pt should receive monitor by Friday.  Attempted to reach pt to notify her of this. Left message to call back on voicemail.

## 2011-05-18 ENCOUNTER — Encounter: Payer: Self-pay | Admitting: Cardiology

## 2011-05-18 ENCOUNTER — Encounter: Payer: Self-pay | Admitting: *Deleted

## 2011-05-18 DIAGNOSIS — R9430 Abnormal result of cardiovascular function study, unspecified: Secondary | ICD-10-CM | POA: Insufficient documentation

## 2011-05-27 ENCOUNTER — Encounter: Payer: Self-pay | Admitting: Cardiology

## 2011-05-27 ENCOUNTER — Ambulatory Visit: Payer: 59 | Admitting: Cardiology

## 2011-07-14 NOTE — Telephone Encounter (Signed)
Pt did not wear [employer] Event monitor. Per [employer] notification they could not reach pt for initiation of monitor. Pt no showed for her follow up appt on 05/27/11 with Dr. Myrtis Ser. A no show letter was mailed that day but pt has not contacted the office to reschedule her appt. Attempted to reach pt to inquire her plans for follow up and wearing the monitor. Unable to reach pt at contact number provided.

## 2012-05-16 ENCOUNTER — Telehealth (HOSPITAL_COMMUNITY): Payer: Self-pay | Admitting: Interventional Radiology

## 2012-05-16 ENCOUNTER — Other Ambulatory Visit (HOSPITAL_COMMUNITY): Payer: Self-pay | Admitting: Interventional Radiology

## 2012-07-16 ENCOUNTER — Emergency Department (HOSPITAL_COMMUNITY): Payer: 59

## 2012-07-16 ENCOUNTER — Emergency Department (HOSPITAL_COMMUNITY)
Admission: EM | Admit: 2012-07-16 | Discharge: 2012-07-17 | Disposition: A | Discharge status: Home / Self Care | Payer: 59 | Attending: Emergency Medicine | Admitting: Emergency Medicine

## 2012-07-16 ENCOUNTER — Encounter (HOSPITAL_COMMUNITY): Payer: Self-pay

## 2012-07-16 DIAGNOSIS — Z79899 Other long term (current) drug therapy: Secondary | ICD-10-CM | POA: Insufficient documentation

## 2012-07-16 DIAGNOSIS — M25519 Pain in unspecified shoulder: Secondary | ICD-10-CM | POA: Insufficient documentation

## 2012-07-16 DIAGNOSIS — Z853 Personal history of malignant neoplasm of breast: Secondary | ICD-10-CM | POA: Insufficient documentation

## 2012-07-16 DIAGNOSIS — Z8719 Personal history of other diseases of the digestive system: Secondary | ICD-10-CM | POA: Insufficient documentation

## 2012-07-16 DIAGNOSIS — M25511 Pain in right shoulder: Secondary | ICD-10-CM

## 2012-07-16 DIAGNOSIS — Z8679 Personal history of other diseases of the circulatory system: Secondary | ICD-10-CM | POA: Insufficient documentation

## 2012-07-16 DIAGNOSIS — Z8543 Personal history of malignant neoplasm of ovary: Secondary | ICD-10-CM | POA: Insufficient documentation

## 2012-07-16 DIAGNOSIS — Z8669 Personal history of other diseases of the nervous system and sense organs: Secondary | ICD-10-CM | POA: Insufficient documentation

## 2012-07-16 DIAGNOSIS — M549 Dorsalgia, unspecified: Secondary | ICD-10-CM | POA: Insufficient documentation

## 2012-07-16 DIAGNOSIS — M542 Cervicalgia: Secondary | ICD-10-CM | POA: Insufficient documentation

## 2012-07-16 MED ORDER — DIAZEPAM 5 MG PO TABS
5.0000 mg | ORAL_TABLET | Freq: Once | ORAL | Status: AC
  Administered 2012-07-17: 5 mg via ORAL
  Filled 2012-07-16: qty 1

## 2012-07-16 NOTE — ED Provider Notes (Signed)
History    This chart was scribed for non-physician practitioner working with Toy Baker, MD by Frederik Pear, ED Scribe. This patient was seen in room WTR5/WTR5 and the patient's care was started at 2310.   CSN: [id number]  Arrival date & time 07/16/12  2300   First MD Initiated Contact with Patient 07/16/12 2310      No chief complaint on file.   (Consider location/radiation/quality/duration/timing/severity/associated sxs/prior treatment) Patient is a 37 y.o. female presenting with shoulder pain. The history is provided by the patient. No language interpreter was used.  Shoulder Pain This is a new problem. The current episode started 12 to 24 hours ago. The problem occurs constantly. Pertinent negatives include no chest pain and no abdominal pain. Exacerbated by: Turning her head. The symptoms are relieved by ice and NSAIDs. She has tried a warm compress (NSAIDS) for the symptoms. The treatment provided mild relief.  Shoulder Pain This is a new problem. The current episode started 12 to 24 hours ago. The problem occurs constantly. Associated symptoms include neck pain. Pertinent negatives include no abdominal pain, chest pain, fever or numbness. Exacerbated by: Turning her head. She has tried a warm compress (NSAIDS) for the symptoms. The treatment provided mild relief.    Deanna Taylor is a 37 y.o. female who presents to the Emergency Department complaining of sudden onset, 10/10, constant, right shoulder pain that radiates to her upper back and to the right side of her neck that is aggravated by turning her head that began at 0930 when she was taking off a t-shirt and heard a pop. She reports that she treated the pain with ibuprofen and heat that seemed to relieve the pain for a period of time before worsening the symptoms.   Past Medical History  Diagnosis Date  . Breast cancer   . Ovarian cancer   . Chest pain     Emergency room April 07, 2011  . Syncope     December, 2012   . Abnormal MRA, head     December, 2012  . Gallbladder disease     CT chest December, 2012, gallbladder sludge and/or numerous tiny gallstones  . Tachycardia     Borderline sinus tachycardia December, 2012  . Ejection fraction     EF 55-60%, echo, 05/2011    No past surgical history on file.  No family history on file.  History  Substance Use Topics  . Smoking status: Never Smoker   . Smokeless tobacco: Never Used  . Alcohol Use: Not on file    OB History   Grav Para Term Preterm Abortions TAB SAB Ect Mult Living                  Review of Systems  Constitutional: Negative for fever.  HENT: Positive for neck pain.   Cardiovascular: Negative for chest pain.  Gastrointestinal: Negative for abdominal pain.  Musculoskeletal: Positive for back pain.       Shoulder pain  Neurological: Negative for numbness.  All other systems reviewed and are negative.    Allergies  Review of patient's allergies indicates no known allergies.  Home Medications   Current Outpatient Rx  Name  Route  Sig  Dispense  Refill  . albuterol (PROVENTIL HFA;VENTOLIN HFA) 108 (90 BASE) MCG/ACT inhaler   Inhalation   Inhale 2 puffs into the lungs 2 (two) times daily as needed. For wheezing          . tamoxifen (NOLVADEX) 10 MG tablet  Oral   Take 10 mg by mouth daily.             BP 129/87  Pulse 103  Temp(Src) 98.5 F (36.9 C) (Oral)  Resp 18  SpO2 98%  Physical Exam  Nursing note and vitals reviewed. Constitutional: She appears well-developed and well-nourished.  HENT:  Head: Normocephalic and atraumatic.  Mouth/Throat: Oropharynx is clear and moist.  Eyes: EOM are normal. Pupils are equal, round, and reactive to light.  Cardiovascular: Normal rate, regular rhythm, normal heart sounds and intact distal pulses.   Good radial pulses.  Pulmonary/Chest: Effort normal and breath sounds normal. She has no wheezes.  Musculoskeletal: Normal range of motion. She exhibits  tenderness.  Full ROM with abduction and adduction of the shoulder.   Some pain with flexion and extension of the right shoulder. No gross deformity of the scapula or shoulder itself. There is tightness with palpation to the left trapezius.  No surrounding erythema, edema, or warmth.  Neurological: She is alert.  Distal sensation of her bilateral hands is intact.  Skin: Skin is warm and dry.  Psychiatric: She has a normal mood and affect. Her behavior is normal.    ED Course  Procedures (including critical care time)  DIAGNOSTIC STUDIES: Oxygen Saturation is 98% on room air, normal by my interpretation.    COORDINATION OF CARE:  23:48- Discussed planned course of treatment with the patient, including icing the area and treating the pain with antiinflammatories and a muscle relaxer, who is agreeable at this time.  00:00- Medication Orders- diazepam (valium) tablet 5 mg- once.   Labs Reviewed - No data to display Dg Shoulder Right  07/16/2012  *RADIOLOGY REPORT*  Clinical Data: Pain in the right shoulder after hearing a pop while taking off her shirt.  RIGHT SHOULDER - 2+ VIEW  Comparison: None.  Findings: The right shoulder appears intact. No evidence of acute fracture or subluxation.  No focal bone lesions.  Bone matrix and cortex appear intact.  No abnormal radiopaque densities in the soft tissues.  Coracoclavicular and acromioclavicular spaces are maintained.  IMPRESSION: No acute bony abnormalities demonstrated in the right shoulder.   Original Report Authenticated By: Burman Nieves, M.D.      No diagnosis found.    MDM  Patient presenting with posterior shoulder pain and right upper back pain.  Xray negative.  Neurovascularly intact.  No erythema, swelling, or warmth of the shoulder.  Suspect that the pain is muscular.  I personally performed the services described in this documentation, which was scribed in my presence. The recorded information has been reviewed and is  accurate.        Pascal Lux [place], PA-C 07/17/12 1537

## 2012-07-16 NOTE — ED Notes (Signed)
Per pt, was at home taking off a shirt.  Felt pop in rt shoulder.  Pain comes and goes.  Using ibuprofen at home with topical rub for pain.

## 2012-07-17 MED ORDER — DIAZEPAM 5 MG PO TABS: 5.0000 mg | ORAL_TABLET | Freq: Two times a day (BID) | ORAL | Status: DC

## 2012-07-17 MED ORDER — NAPROXEN 500 MG PO TABS: 500.0000 mg | ORAL_TABLET | Freq: Two times a day (BID) | ORAL | Status: AC

## 2012-07-18 NOTE — ED Provider Notes (Signed)
Medical screening examination/treatment/procedure(s) were performed by non-physician practitioner and as supervising physician I was immediately available for consultation/collaboration.  Lyanne Co, MD 07/18/12 [phone]

## 2013-05-25 ENCOUNTER — Encounter (INDEPENDENT_AMBULATORY_CARE_PROVIDER_SITE_OTHER): Payer: Self-pay

## 2013-05-25 ENCOUNTER — Encounter (INDEPENDENT_AMBULATORY_CARE_PROVIDER_SITE_OTHER): Payer: Self-pay | Admitting: General Surgery

## 2013-05-25 ENCOUNTER — Ambulatory Visit (INDEPENDENT_AMBULATORY_CARE_PROVIDER_SITE_OTHER): Payer: 59 | Admitting: General Surgery

## 2013-05-25 DIAGNOSIS — R000 Tachycardia, unspecified: Secondary | ICD-10-CM

## 2013-05-25 DIAGNOSIS — M2550 Pain in unspecified joint: Secondary | ICD-10-CM

## 2013-05-25 DIAGNOSIS — R55 Syncope and collapse: Secondary | ICD-10-CM

## 2013-05-25 DIAGNOSIS — K829 Disease of gallbladder, unspecified: Secondary | ICD-10-CM

## 2013-05-25 DIAGNOSIS — Z6841 Body Mass Index (BMI) 40.0 and over, adult: Secondary | ICD-10-CM

## 2013-05-25 NOTE — Progress Notes (Signed)
Subjective:   morbid obesity  Patient ID: Deanna Taylor, female   DOB: March 10, 1976, 38 y.o.   MRN: [id number]  HPI Deanna Taylor 38 y.o.female presents for consideration for surgical treatment for morbid obesity.  She  gives a history of progressive obesity since young adulthood despite multiple attempts at medical management. She has been able to lose significant amounts of weight with various diet programs, up to 70 pounds at a time but then he experiences weight regain plus additional weight.  her weight has been affecting her in a number of ways including increasing difficulty enjoying routine activities as well as fatigue and chronic knee pain..   She has been to our initial information seminar, researched surgical options thoroughly and is interested in possibly LAP-BAND or gastric sleeve.  Past Medical History  Diagnosis Date  . Breast cancer   . Ovarian cancer   . Chest pain     Emergency room April 07, 2011  . Syncope     December, 2012  . Abnormal MRA, head     December, 2012  . Gallbladder disease     CT chest December, 2012, gallbladder sludge and/or numerous tiny gallstones  . Tachycardia     Borderline sinus tachycardia December, 2012  . Ejection fraction     EF 55-60%, echo, 05/2011   Past Surgical History  Procedure Laterality Date  . Abdominal hysterectomy    . Breast surgery     Current Outpatient Prescriptions  Medication Sig Dispense Refill  . naproxen (NAPROSYN) 500 MG tablet Take 1 tablet (500 mg total) by mouth 2 (two) times daily.  30 tablet  0   No current facility-administered medications for this visit.   No Known Allergies     Review of Systems  Constitutional: Positive for fatigue.  HENT: Negative.   Respiratory: Positive for wheezing (Rare). Negative for cough and shortness of breath.   Cardiovascular: Negative.   Gastrointestinal: Negative.   Genitourinary: Negative.   Musculoskeletal: Positive for arthralgias.       Objective:   Physical Exam BP 126/78  Pulse 80  Temp(Src) 97.7 F (36.5 C) (Temporal)  Resp 14  Ht '4\' 11"'  (1.499 m)  Wt 221 lb (100.245 kg)  BMI 44.61 kg/m2 General: Alert, morbidly obese African American female, in no distress Skin: Warm and dry without rash or infection. HEENT: No palpable masses or thyromegaly. Sclera nonicteric. Pupils equal round and reactive. Oropharynx clear. Lymph nodes: No cervical, supraclavicular, or inguinal nodes palpable. Lungs: Breath sounds clear and equal without increased work of breathing Cardiovascular: Regular rate and rhythm without murmur. No JVD or edema. Peripheral pulses intact. Abdomen: Nondistended. Soft and nontender. No masses palpable. No organomegaly. No palpable hernias. Extremities: No edema or joint swelling or deformity. No chronic venous stasis changes. Neurologic: Alert and fully oriented. Gait normal.    Assessment:     38 year old female with progressive morbid obesity unresponsive to multiple efforts at medical management. She has a comorbidity of joint pain. She has significant risk to her health going forward and her current weight. We discussed surgical options available in detail including gastric bypass, sleeve gastrectomy and lap band. Pros and cons of these operations were also discussed in detail. After our discussion she feels confident that sleeve gastrectomy would best fit her needs and expectations. We discussed the nature of the surgery and expected recovery period we discussed expected weight loss. Risks of surgery were discussed including risks of general anesthesia, bleeding, leak and infection and rare risk of death  as well as long-term complications including stricture and ulceration and reflux and need for revisional surgery. She was given a complete consent form review and referred for the [facility] program. Of note she does have a personal history of breast and ovarian cancer and is followed closely at the cancer center. She has 2  sisters who are BRCA positive she has declined testing to this point. She also has known gallstones and I have recommended cholecystectomy at the time of her sleeve gastrectomy due to the risk of this becoming symptomatic with weight loss.    Plan:     Begin workup for planned sleeve gastrectomy including psychologic and nutrition evaluations, upper GI series, ultrasound of the gallbladder and routine lab work. We will see her back following these studies.

## 2013-06-08 ENCOUNTER — Other Ambulatory Visit (INDEPENDENT_AMBULATORY_CARE_PROVIDER_SITE_OTHER): Payer: Self-pay

## 2013-06-08 DIAGNOSIS — I779 Disorder of arteries and arterioles, unspecified: Secondary | ICD-10-CM

## 2013-06-08 DIAGNOSIS — R55 Syncope and collapse: Secondary | ICD-10-CM

## 2013-06-08 DIAGNOSIS — I739 Peripheral vascular disease, unspecified: Secondary | ICD-10-CM

## 2013-06-08 DIAGNOSIS — R000 Tachycardia, unspecified: Secondary | ICD-10-CM

## 2013-06-08 DIAGNOSIS — K829 Disease of gallbladder, unspecified: Secondary | ICD-10-CM

## 2013-06-22 ENCOUNTER — Ambulatory Visit (HOSPITAL_COMMUNITY)
Admission: RE | Admit: 2013-06-22 | Discharge: 2013-06-22 | Disposition: A | Discharge status: Home / Self Care | Payer: 59 | Source: Ambulatory Visit | Attending: General Surgery | Admitting: General Surgery

## 2013-06-22 ENCOUNTER — Ambulatory Visit (HOSPITAL_COMMUNITY)
Admission: RE | Admit: 2013-06-22 | Discharge: 2013-06-22 | Disposition: A | Discharge status: Home / Self Care | Payer: 59 | Source: Ambulatory Visit | Attending: Surgery | Admitting: Surgery

## 2013-06-22 ENCOUNTER — Other Ambulatory Visit: Payer: Self-pay

## 2013-06-22 DIAGNOSIS — R0789 Other chest pain: Secondary | ICD-10-CM | POA: Insufficient documentation

## 2013-06-22 DIAGNOSIS — K829 Disease of gallbladder, unspecified: Secondary | ICD-10-CM | POA: Insufficient documentation

## 2013-06-22 DIAGNOSIS — R000 Tachycardia, unspecified: Secondary | ICD-10-CM | POA: Insufficient documentation

## 2013-06-22 DIAGNOSIS — Z6841 Body Mass Index (BMI) 40.0 and over, adult: Secondary | ICD-10-CM | POA: Insufficient documentation

## 2013-06-22 DIAGNOSIS — R55 Syncope and collapse: Secondary | ICD-10-CM | POA: Insufficient documentation

## 2013-06-23 ENCOUNTER — Encounter: Payer: 59 | Attending: Surgery | Admitting: Dietician

## 2013-06-23 ENCOUNTER — Encounter: Payer: Self-pay | Admitting: Dietician

## 2013-06-23 DIAGNOSIS — Z01818 Encounter for other preprocedural examination: Secondary | ICD-10-CM | POA: Insufficient documentation

## 2013-06-23 DIAGNOSIS — Z713 Dietary counseling and surveillance: Secondary | ICD-10-CM | POA: Insufficient documentation

## 2013-06-23 NOTE — Patient Instructions (Signed)
Patient to follow up in 4 weeks for second SWL visit. Start working on pre-op goals.

## 2013-06-23 NOTE — Progress Notes (Signed)
  Pre-Op Assessment Visit:  Pre-Operative Gastric Sleeve Surgery  Medical Nutrition Therapy:  Appt start time: 7858   End time:  0945.  Patient was seen on 06/23/2013 for Pre-Operative Gastric Sleeve Nutrition Assessment. Assessment and letter of approval faxed to [facility] Surgery Bariatric Surgery Program coordinator on 06/23/2013.   Preferred Learning Style:  No preference indicated   Learning Readiness:   Ready  Handouts given during visit include:  Pre-Op Goals Bariatric Surgery Protein Shakes  Teaching Method Utilized:  Visual Auditory  Barriers to learning/adherence to lifestyle change: none  Demonstrated degree of understanding via:  Teach Back   Patient to call the [facility] to enroll in Pre-Op and Post-Op Nutrition Education when surgery date is scheduled.

## 2013-07-24 ENCOUNTER — Encounter: Payer: 59 | Attending: General Surgery | Admitting: Dietician

## 2013-07-24 DIAGNOSIS — E663 Overweight: Secondary | ICD-10-CM | POA: Insufficient documentation

## 2013-07-24 DIAGNOSIS — Z713 Dietary counseling and surveillance: Secondary | ICD-10-CM | POA: Insufficient documentation

## 2013-07-24 NOTE — Progress Notes (Signed)
  Medical Nutrition Therapy:  Appt start time: 0915 end time:  0930.  Assessment:  Primary concerns today: SWL visit #1.  Drinking coffee with lots of cream.  MEDICATIONS: naproxen  DIETARY INTAKE:  24-hr recall:  B (AM): bagel and grapes and 32 oz water today  Snk (AM) :none  L (PM): Smoothie (fruit and protein powder and oats)  Snk (PM): sometimes: coffee D (PM): chicken or ground [place] with brown rice and spinach  Snk (PM): apples and cheese  Beverages: smoothies, water, and coffee  Recent physical activity: zumba and roller skating  Estimated energy needs: 1600-1800 calories  Progress Towards Goal(s):  In progress.   Nutritional Diagnosis:  [place]-3.3 Overweight/obesity As related to excessive enegy intake.  As evidenced by pursuing weight loss surgery; BMI 45    Intervention:  Nutrition counseling provided.  Handouts given during visit include:  15g CHO + protein snacks  Monitoring/Evaluation:  Dietary intake, exercise, and body weight in 4 week(s).

## 2013-07-24 NOTE — Patient Instructions (Addendum)
-  Remember to eat breakfast -Keep exercising! -Gradually reduce amount of cream in coffee -Reduce candy intake at work; keep other healthy snacks at work: cheese and apples or grapes; almonds

## 2013-08-23 ENCOUNTER — Encounter: Payer: 59 | Attending: Surgery | Admitting: Dietician

## 2013-08-23 DIAGNOSIS — Z01818 Encounter for other preprocedural examination: Secondary | ICD-10-CM | POA: Insufficient documentation

## 2013-08-23 DIAGNOSIS — Z713 Dietary counseling and surveillance: Secondary | ICD-10-CM | POA: Insufficient documentation

## 2013-08-23 NOTE — Progress Notes (Signed)
  Medical Nutrition Therapy:  Appt start time: 445 end time:  500.  Assessment:  Primary concerns today: SWL visit #2.   Trying to drink coffee with less cream. Eating mangoes or yogurt for breakfast. Continuing to exercise regularly. Trying to eat less candy at work - took candy canister off desk.  MEDICATIONS: naproxen  DIETARY INTAKE:  24-hr recall:  B (AM): yogurt and mangoes Snk (AM) :string cheese and pita chips; frozen grapes  L (PM): Smoothie (fruit and protein powder and oats) OR salad Snk (PM): peanut butter and apples D (PM): chicken, fish, ground [place] with brown rice and spinach  Snk (PM): gummies  Beverages: smoothies, water, and coffee  Recent physical activity: zumba and roller skating  Estimated energy needs: 1600-1800 calories  Progress Towards Goal(s):  In progress.   Nutritional Diagnosis:  [place]-3.3 Overweight/obesity As related to excessive enegy intake.  As evidenced by pursuing weight loss surgery; BMI 45    Intervention:  Nutrition counseling provided.  Monitoring/Evaluation:  Dietary intake, exercise, and body weight in 4 week(s).

## 2013-08-23 NOTE — Patient Instructions (Addendum)
-  Keep eating breakfast -Keep exercising! -Gradually reduce amount of cream in coffee; reduce coffee overall -Remove candy jar from desk

## 2013-09-20 ENCOUNTER — Encounter: Payer: 59 | Attending: Surgery | Admitting: Dietician

## 2013-09-20 DIAGNOSIS — Z713 Dietary counseling and surveillance: Secondary | ICD-10-CM | POA: Insufficient documentation

## 2013-09-20 DIAGNOSIS — Z01818 Encounter for other preprocedural examination: Secondary | ICD-10-CM | POA: Insufficient documentation

## 2013-09-20 NOTE — Patient Instructions (Addendum)
-  Keep eating breakfast -Keep exercising! -Gradually reduce amount of cream in coffee; reduce coffee overall -Remove candy jar from desk  -Try not to skip lunch

## 2013-09-20 NOTE — Progress Notes (Signed)
  Medical Nutrition Therapy:  Appt start time: 1130 end time:  3845.  Assessment:  Primary concerns today: SWL visit #3.   Eating mangoes or yogurt for breakfast. Continuing to exercise regularly. Trying to eat less candy at work - took candy canister off desk. Has had no candy today! Drinking less coffee and trying to wean herself off sugar.  MEDICATIONS: naproxen  DIETARY INTAKE:  24-hr recall:  B (AM): yogurt and mangoes Snk (AM) :string cheese and pita chips; frozen grapes  L (PM): skipping sometimes; fruit Snk (PM): water or green tea D (PM): chicken, fish, ground [place] with brown rice and spinach  Snk (PM): grapes and almonds  Beverages: green tea, water, and coffee  Recent physical activity: zumba in the park  Estimated energy needs: 1600-1800 calories  Progress Towards Goal(s):  In progress.   Nutritional Diagnosis:  [place]-3.3 Overweight/obesity As related to excessive enegy intake.  As evidenced by pursuing weight loss surgery; BMI 45    Intervention:  Nutrition counseling provided.  Monitoring/Evaluation:  Dietary intake, exercise, and body weight in 4 week(s).

## 2013-10-23 ENCOUNTER — Encounter: Payer: 59 | Attending: Surgery | Admitting: Dietician

## 2013-10-23 DIAGNOSIS — Z713 Dietary counseling and surveillance: Secondary | ICD-10-CM | POA: Insufficient documentation

## 2013-10-23 DIAGNOSIS — Z01818 Encounter for other preprocedural examination: Secondary | ICD-10-CM | POA: Insufficient documentation

## 2013-10-23 NOTE — Progress Notes (Signed)
  Medical Nutrition Therapy:  Appt start time: 1200 end time:  1215.  Follow up:  Primary concerns today: SWL visit #4.   Zaynah returns today having gained 1 pound since her last visit. She has been to several weddings lately and her family has been in town from [place].  Family has been in town visiting from [place] and she has been "eating well". Annahi states she has been eating a lot of "[place]-style" meats like goat and seafood and cocoa bread but avoiding processed food. Has been avoiding candy and swimming a lot. Practicing taking supplements and looking for some that she likes. Practicing chewing thoroughly and not drinking while eating in preparation for surgery.      MEDICATIONS: naproxen  DIETARY INTAKE:  24-hr recall:  B (AM): yogurt and mangoes Snk (AM) :string cheese and pita chips; frozen grapes  L (PM): skipping sometimes; fruit Snk (PM): water or green tea D (PM): chicken, fish, ground [place] with brown rice and spinach  Snk (PM): grapes and almonds  Beverages: green tea, water, and coffee  Recent physical activity: swimming  Estimated energy needs: 1600-1800 calories  Progress Towards Goal(s):  In progress.   Nutritional Diagnosis:  [place]-3.3 Overweight/obesity As related to excessive enegy intake.  As evidenced by pursuing weight loss surgery; BMI 45    Intervention:  Nutrition counseling provided.  Monitoring/Evaluation:  Dietary intake, exercise, and body weight in 4 week(s).

## 2013-10-23 NOTE — Patient Instructions (Signed)
-  Keep eating breakfast -Keep exercising! -Gradually reduce amount of cream in coffee; reduce coffee overall -Remove candy jar from desk  -Try not to skip lunch -Try to find supplements that you like

## 2013-11-22 ENCOUNTER — Encounter: Payer: 59 | Attending: Surgery | Admitting: Dietician

## 2013-11-22 DIAGNOSIS — Z01818 Encounter for other preprocedural examination: Secondary | ICD-10-CM | POA: Diagnosis not present

## 2013-11-22 DIAGNOSIS — Z713 Dietary counseling and surveillance: Secondary | ICD-10-CM | POA: Insufficient documentation

## 2013-11-22 NOTE — Patient Instructions (Signed)
-  Keep eating breakfast -Keep exercising! -Gradually reduce amount of cream in coffee; reduce coffee overall -Remove candy jar from desk  -Try not to skip lunch -Try to find supplements that you like

## 2013-11-22 NOTE — Progress Notes (Signed)
  Medical Nutrition Therapy:  Appt start time: 235 end time:  250.  Follow up:  Primary concerns today: SWL visit #5.   Antonina returns having gained several pounds since her last visit. However, she reports that she has been on steroid shots for the last month. Has been working on pre op goals: counting her chews. Having a hard time not drinking while eating, stating she is "more of a drinker than an eater". She is also having a hard time finding a protein shake she likes.   Sample provided and patient educated on proper use: Premier protein shake (strawberry - qty 1) Lot#: [id number] Exp: 04/2014   MEDICATIONS: naproxen  DIETARY INTAKE:  24-hr recall:  B (AM): yogurt and mangoes Snk (AM) :string cheese and pita chips; frozen grapes  L (PM): skipping sometimes; fruit Snk (PM): water or green tea D (PM): chicken, fish, ground [place] with brown rice and spinach  Snk (PM): grapes and almonds  Beverages: green tea, water, and coffee  Recent physical activity: swimming  Estimated energy needs: 1600-1800 calories  Progress Towards Goal(s):  In progress.   Nutritional Diagnosis:  [place]-3.3 Overweight/obesity As related to excessive enegy intake.  As evidenced by pursuing weight loss surgery; BMI 45    Intervention:  Nutrition counseling provided.  Monitoring/Evaluation:  Dietary intake, exercise, and body weight in 4 week(s).

## 2013-12-20 ENCOUNTER — Encounter: Payer: 59 | Attending: Surgery | Admitting: Dietician

## 2013-12-20 DIAGNOSIS — Z713 Dietary counseling and surveillance: Secondary | ICD-10-CM | POA: Diagnosis not present

## 2013-12-20 DIAGNOSIS — Z01818 Encounter for other preprocedural examination: Secondary | ICD-10-CM | POA: Insufficient documentation

## 2013-12-20 NOTE — Patient Instructions (Addendum)
-  Keep eating breakfast -Keep exercising! -Keep working on not drinking while eating  - [email or web address] (fill out registration page)

## 2013-12-20 NOTE — Progress Notes (Signed)
  Medical Nutrition Therapy:  Appt start time: 900 end time:  403.  Follow up:  Primary concerns today: SWL visit #6.   Deanna Taylor returns for her 6th and final SWL visit. She has lost about 3 pounds since last visit, despite continued steroid shots. She does office work for a Engineer, civil (consulting) and reports work has been very busy lately. She has started taking vitamins, Women's One a Day and Flintstones. She also tried Community education officer and likes them. Still exercising and eating breakfast regularly. Having a hard time not drinking while eating, stating she is "more of a drinker than an eater". Has been putting protein powder in coffee.    MEDICATIONS: naproxen  DIETARY INTAKE:  24-hr recall:  B (AM): yogurt and mangoes Snk (AM) :string cheese and pita chips; frozen grapes  L (PM): skipping sometimes; fruit Snk (PM): water or green tea D (PM): chicken, fish, ground [place] with brown rice and spinach  Snk (PM): grapes and almonds  Beverages: green tea, water, and coffee  Recent physical activity: swimming  Estimated energy needs: 1600-1800 calories  Progress Towards Goal(s):  In progress.   Nutritional Diagnosis:  [place]-3.3 Overweight/obesity As related to excessive enegy intake.  As evidenced by pursuing weight loss surgery; BMI 45    Intervention:  Nutrition counseling provided.  Monitoring/Evaluation:  Patient to call [facility] when surgery is scheduled.

## 2014-09-16 IMAGING — CR DG CHEST 2V
3 series · 3 of 3 positions shown · non-contrast
Comparison: [facility] chest x-ray April 07, 2011.

CLINICAL DATA: Chest tightness

EXAM:
CHEST  2 VIEW

[view not recorded (1 of 3)]
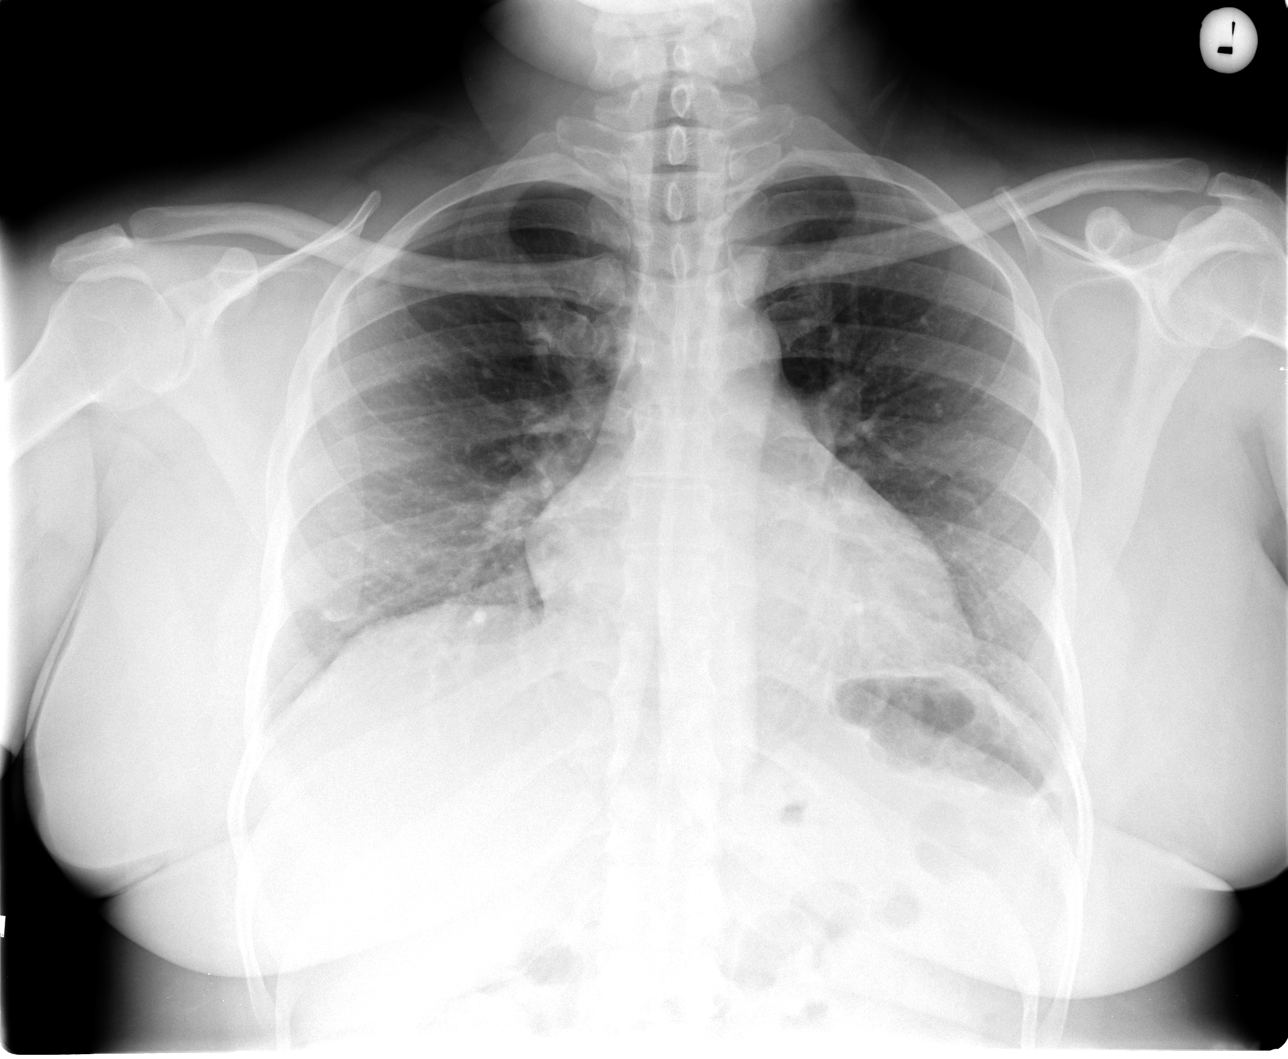

[view not recorded (2 of 3)]
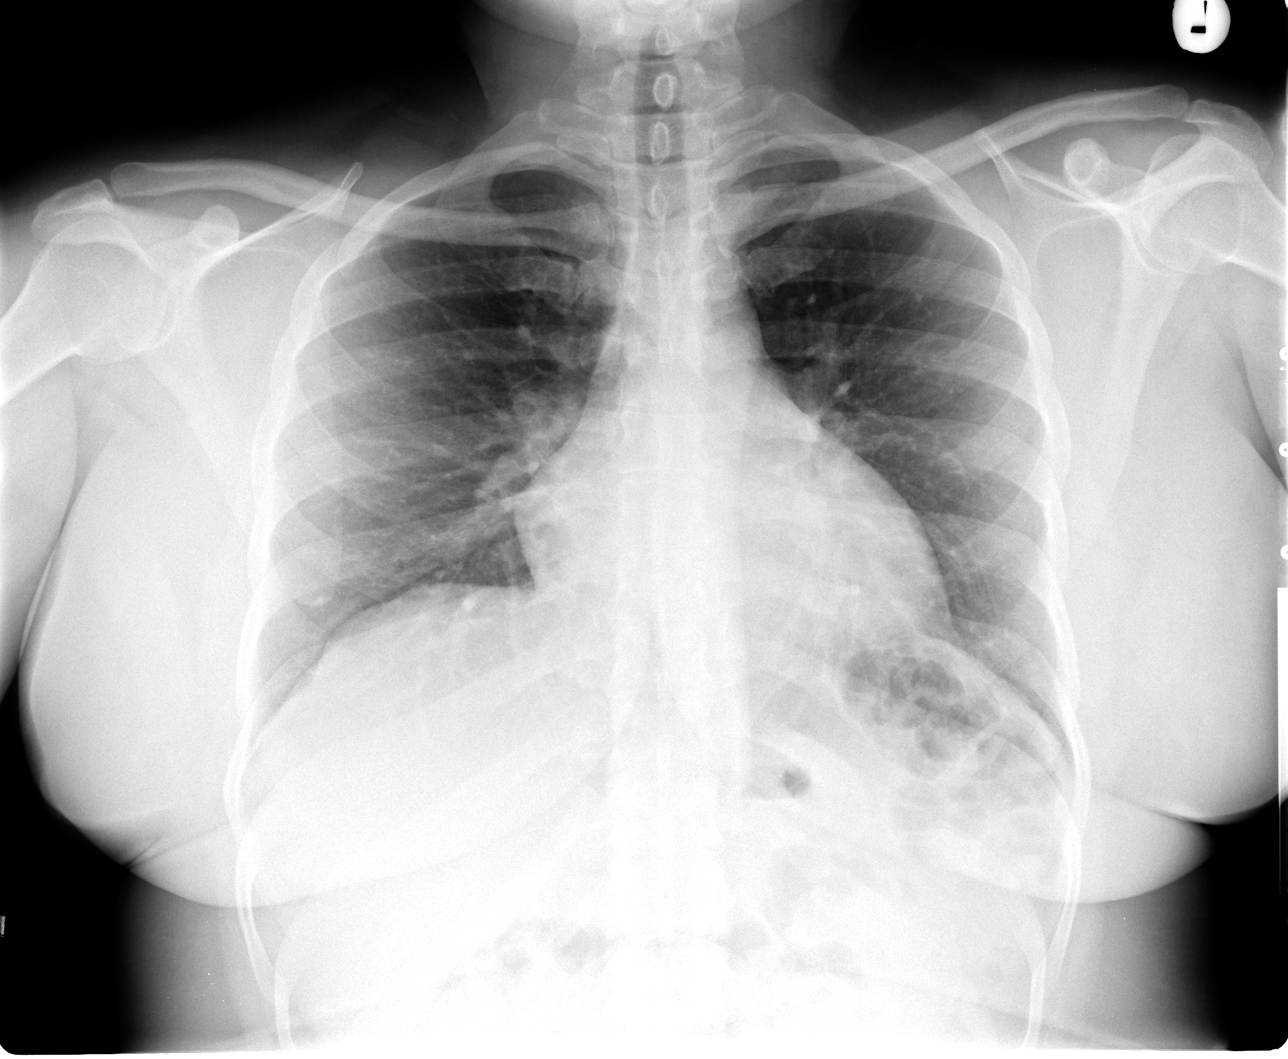

[view not recorded (3 of 3)]
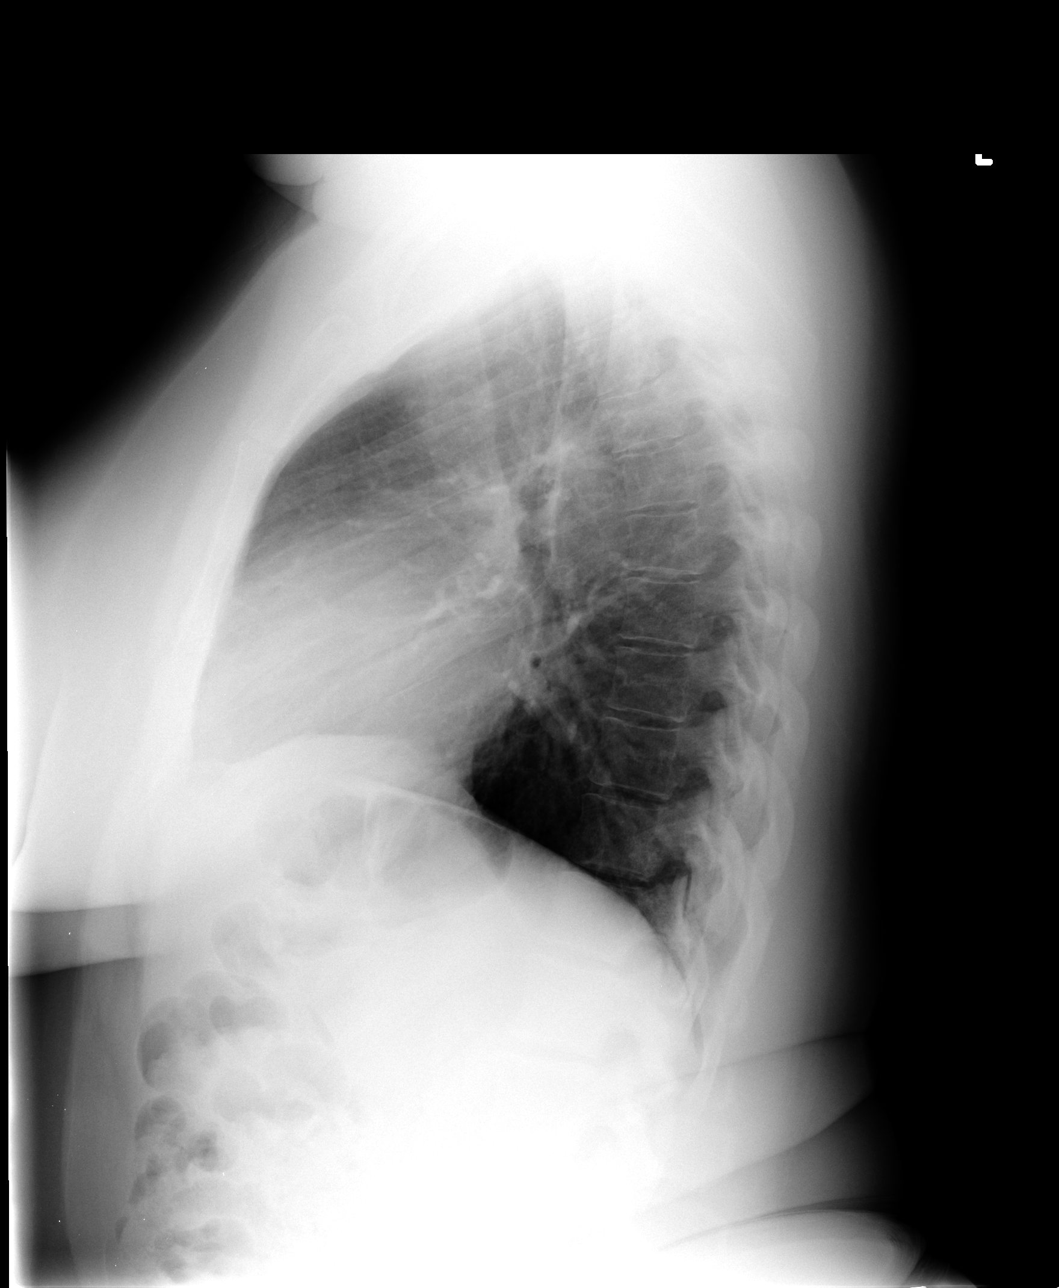

[3 of 3 positions shown; findings below may reference images not displayed]

FINDINGS: The lungs are hypoinflated on today's study. There is no focal
infiltrate. The cardiopericardial silhouette is normal in size. The
pulmonary vascularity is not engorged. The cardiac apex is
left-sided. The stomach bubble is on the left as well. The trachea
is midline. The observed portions of the bony thorax appear normal.
IMPRESSION: There is no evidence of active cardiopulmonary disease.

## 2014-09-16 IMAGING — US US ABDOMEN COMPLETE
1 series · 14 of 25 positions shown · non-contrast
Comparison: No similar prior exam is available at this institution
for comparison or on [HOSPITAL] PACS.

CLINICAL DATA: Pre bariatric screening exam.  Morbid obesity.

EXAM:
ULTRASOUND ABDOMEN COMPLETE

[Series 1: us abdomen complete · 14 of 62 slices shown]
[im 1/62]
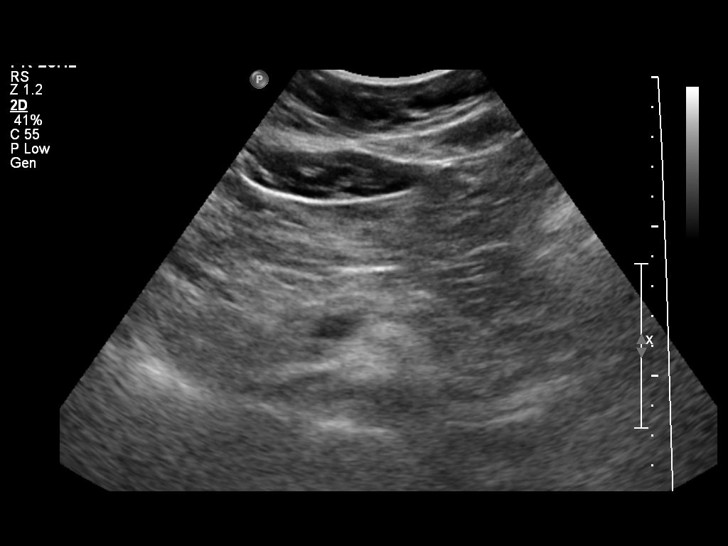
[im 6/62]
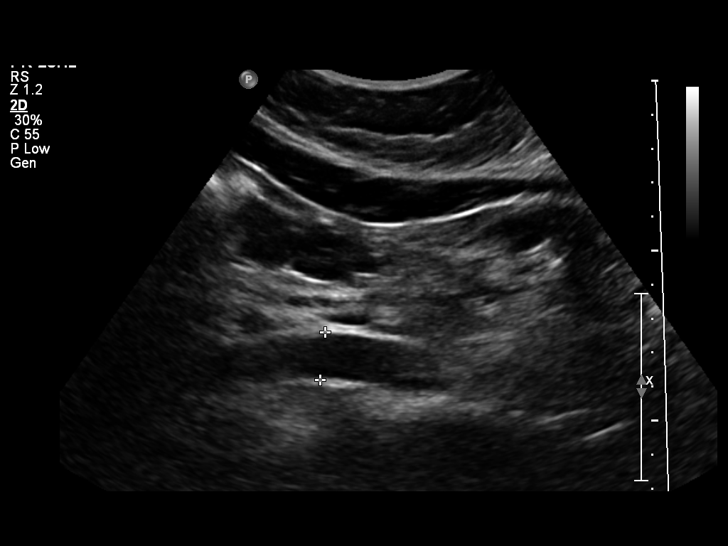
[im 11/62]
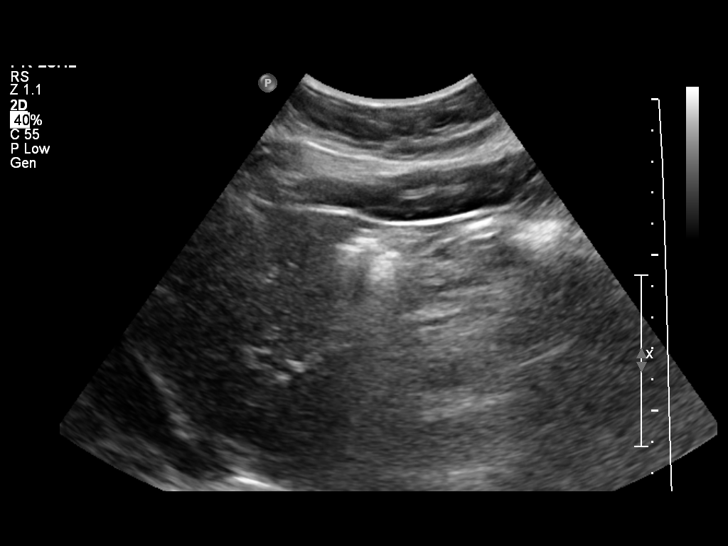
[im 16/62]
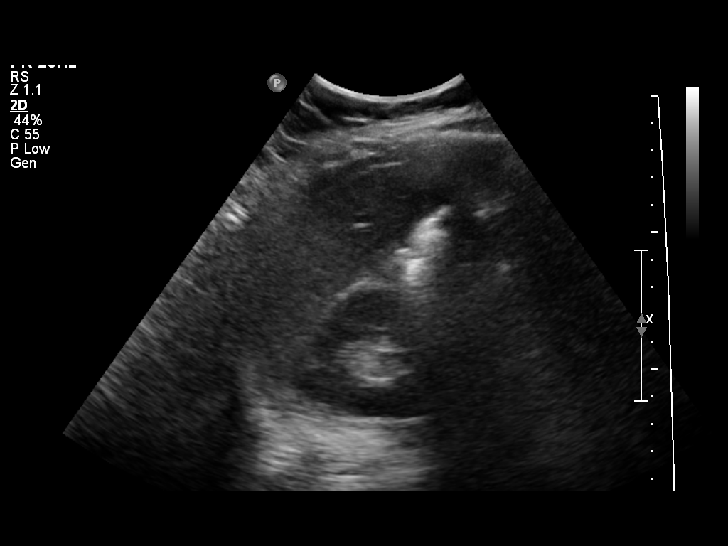
[im 21/62]
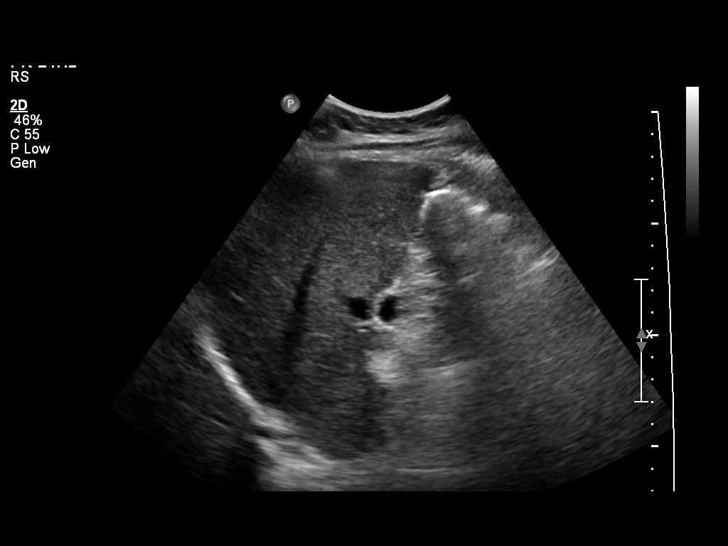
[im 23/62]
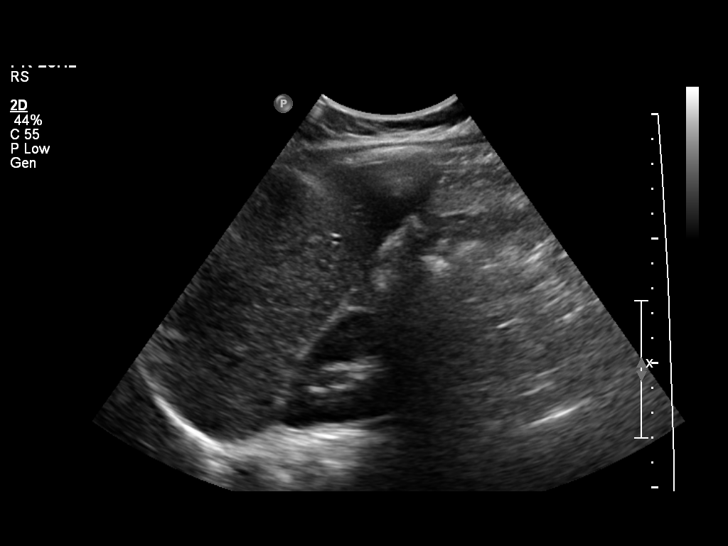
[im 28/62]
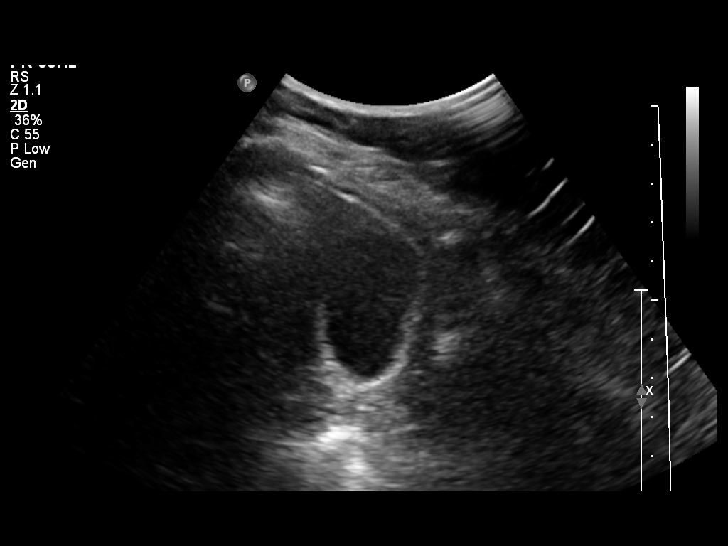
[im 34/62]
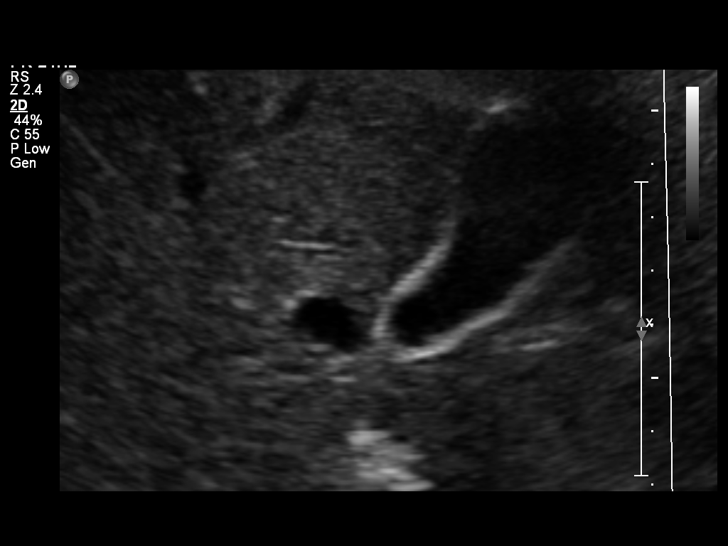
[im 39/62]
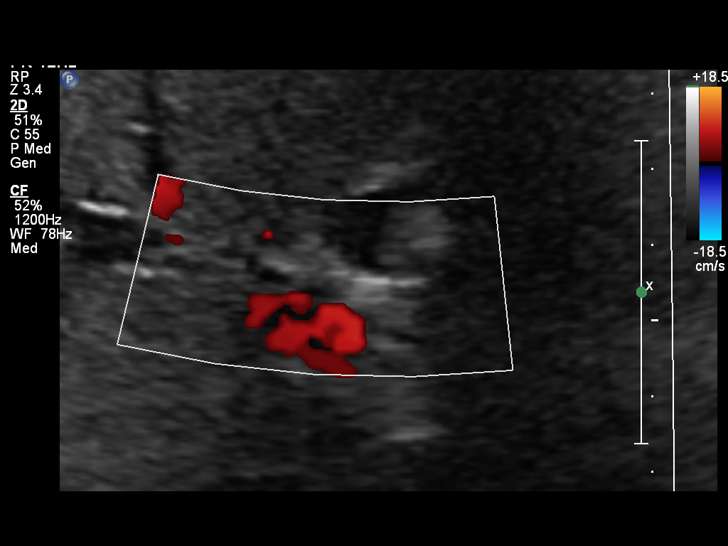
[im 41/62]
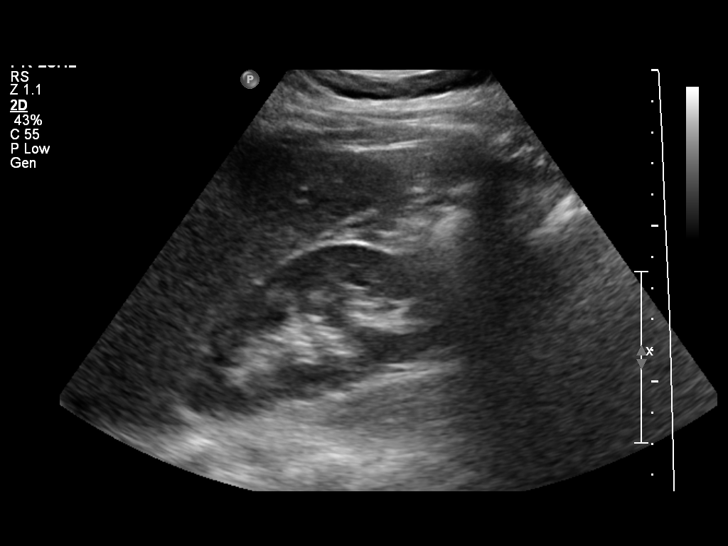
[im 46/62]
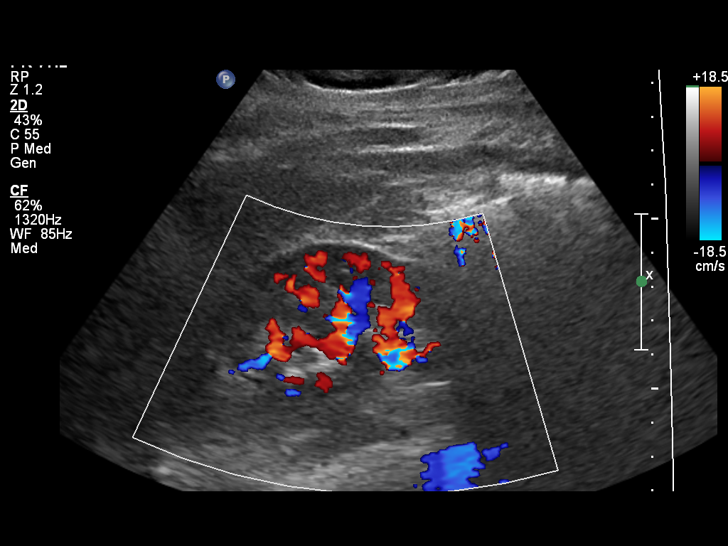
[im 51/62]
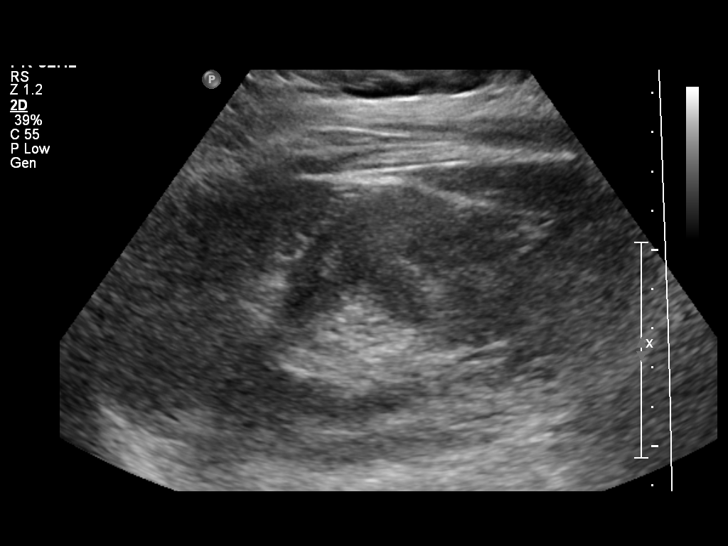
[im 56/62]
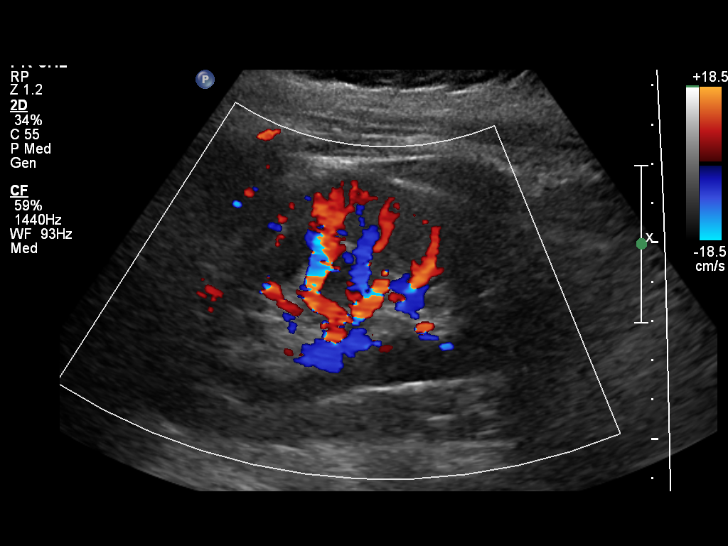
[im 62/62]
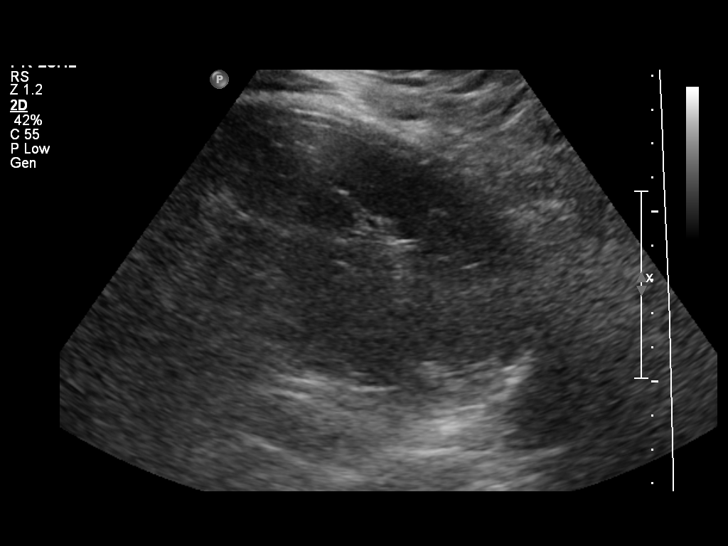

[14 of 25 positions shown; findings below may reference images not displayed]

FINDINGS: Gallbladder:

No gallstones or wall thickening visualized. No sonographic Murphy
sign noted.

Common bile duct:

Diameter: 4 mm.  Normal.

Liver:

No focal lesion identified. Within normal limits in parenchymal
echogenicity.

IVC:

No abnormality visualized.

Pancreas:

Visualized portion unremarkable.

Spleen:

Size and appearance within normal limits.

Right Kidney:

Length: 11 cm. Echogenicity within normal limits. No mass or
hydronephrosis visualized.

Left Kidney:

Length: 10.9 cm. Echogenicity within normal limits. No mass or
hydronephrosis visualized.

Abdominal aorta:

Mid and distal are normal in appearance. Proximal aorta obscured by
bowel gas.

Other findings:

No ascites.
IMPRESSION: Normal exam.

## 2018-02-10 DIAGNOSIS — Z23 Encounter for immunization: Secondary | ICD-10-CM | POA: Diagnosis not present

## 2018-05-03 DIAGNOSIS — Z6841 Body Mass Index (BMI) 40.0 and over, adult: Secondary | ICD-10-CM | POA: Diagnosis not present

## 2018-05-24 ENCOUNTER — Ambulatory Visit (HOSPITAL_COMMUNITY): Payer: Self-pay | Admitting: Licensed Clinical Social Worker

## 2018-06-21 ENCOUNTER — Ambulatory Visit (HOSPITAL_COMMUNITY): Payer: Self-pay | Admitting: Psychiatry
